# Patient Record
Sex: Female | Born: 1990 | Race: Black or African American | Hispanic: No | Marital: Single | Smoking: Never smoker
Health system: Southern US, Community
[De-identification: ages and names within clinical notes are randomized; demographics above are authoritative.]

## PROBLEM LIST (undated history)

## (undated) DIAGNOSIS — T7840XA Allergy, unspecified, initial encounter: Secondary | ICD-10-CM

## (undated) DIAGNOSIS — K8020 Calculus of gallbladder without cholecystitis without obstruction: Secondary | ICD-10-CM

## (undated) DIAGNOSIS — F32A Depression, unspecified: Secondary | ICD-10-CM

## (undated) DIAGNOSIS — R011 Cardiac murmur, unspecified: Secondary | ICD-10-CM

## (undated) DIAGNOSIS — F329 Major depressive disorder, single episode, unspecified: Secondary | ICD-10-CM

## (undated) HISTORY — DX: Cardiac murmur, unspecified: R01.1

## (undated) HISTORY — DX: Allergy, unspecified, initial encounter: T78.40XA

## (undated) HISTORY — PX: WISDOM TOOTH EXTRACTION: SHX21

## (undated) HISTORY — DX: Major depressive disorder, single episode, unspecified: F32.9

## (undated) HISTORY — DX: Depression, unspecified: F32.A

## (undated) HISTORY — PX: COLONOSCOPY: SHX174

---

## 2012-12-15 ENCOUNTER — Ambulatory Visit (INDEPENDENT_AMBULATORY_CARE_PROVIDER_SITE_OTHER): Payer: Managed Care, Other (non HMO) | Admitting: Obstetrics

## 2012-12-15 ENCOUNTER — Encounter: Payer: Self-pay | Admitting: Obstetrics

## 2012-12-15 VITALS — BP 129/75 | HR 99 | Temp 99.1°F | Ht 65.0 in | Wt 140.0 lb

## 2012-12-15 DIAGNOSIS — Z3009 Encounter for other general counseling and advice on contraception: Secondary | ICD-10-CM

## 2012-12-15 DIAGNOSIS — N946 Dysmenorrhea, unspecified: Secondary | ICD-10-CM

## 2012-12-15 DIAGNOSIS — R109 Unspecified abdominal pain: Secondary | ICD-10-CM

## 2012-12-15 LAB — POCT URINALYSIS DIPSTICK
Spec Grav, UA: 1.02
Urobilinogen, UA: NEGATIVE
pH, UA: 5

## 2012-12-15 MED ORDER — MEDROXYPROGESTERONE ACETATE 150 MG/ML IM SUSP: 150.0000 mg | INTRAMUSCULAR | Status: DC

## 2012-12-15 NOTE — Progress Notes (Signed)
Subjective:    Tamara Gould is a 22 y.o. female new patient who presents for evaluation of menstrual symptoms. Symptoms began 5 months ago. Patient describes symptoms of bloating/fluid retention (mild), labile mood (mild), menorrhagia (mild), menstrual cramping (severe), pelvic pain (severe) and nausea and vomiting. Symptoms occur 3 days prior to onset of menses. Patient denies anxiety, breast tenderness, decreased libido, depression, dyspareunia, insomnia and migraine headaches. Evaluation to date includes: none. Treatment to date includes: OTC NSAIDs (somewhat effective). The patient is sexually active.   Menstrual History: OB History   Grav Para Term Preterm Abortions TAB SAB Ect Mult Living   0 0 0 0 0 0 0 0 0 0       Menarche age: 16  Patient's last menstrual period was 12/14/2012.    The following portions of the patient's history were reviewed and updated as appropriate: allergies, current medications, past family history, past medical history, past social history, past surgical history and problem list.  Review of Systems Pertinent items are noted in HPI.   Objective:    BP 129/75  Pulse 99  Temp(Src) 99.1 F (37.3 C) (Oral)  Ht 5\' 5"  (1.651 m)  Wt 140 lb (63.504 kg)  BMI 23.3 kg/m2  LMP 12/14/2012 General appearance: alert and no distress Abdomen: normal findings: soft, non-tender Pelvic: cervix normal in appearance, external genitalia normal, no adnexal masses or tenderness, no cervical motion tenderness, uterus normal size, shape, and consistency, vagina normal without discharge and menstrual blood present.   Assessment:    Dysmenorrhea: moderate Counseling for contraception.    Plan:    I started Depo per medication orders.

## 2012-12-15 NOTE — Patient Instructions (Signed)
Routine

## 2012-12-18 ENCOUNTER — Ambulatory Visit (INDEPENDENT_AMBULATORY_CARE_PROVIDER_SITE_OTHER): Payer: Managed Care, Other (non HMO) | Admitting: *Deleted

## 2012-12-18 VITALS — BP 112/73 | HR 85 | Wt 135.0 lb

## 2012-12-18 DIAGNOSIS — Z309 Encounter for contraceptive management, unspecified: Secondary | ICD-10-CM

## 2012-12-18 DIAGNOSIS — IMO0001 Reserved for inherently not codable concepts without codable children: Secondary | ICD-10-CM

## 2012-12-18 MED ORDER — MEDROXYPROGESTERONE ACETATE 150 MG/ML IM SUSP
150.0000 mg | INTRAMUSCULAR | Status: DC
  Administered 2012-12-18: 150 mg via INTRAMUSCULAR

## 2012-12-18 NOTE — Progress Notes (Signed)
Pt in office today for a Depo injection.  Medroxyprogesterone 150mg  given IM in Right Deltoid Lot [id number] Exp. 12/2014 Pt to return to office 03-11-13

## 2012-12-28 ENCOUNTER — Ambulatory Visit: Payer: Self-pay | Admitting: Obstetrics & Gynecology

## 2013-03-12 ENCOUNTER — Ambulatory Visit: Payer: Self-pay | Admitting: Obstetrics & Gynecology

## 2013-03-12 ENCOUNTER — Ambulatory Visit: Payer: Managed Care, Other (non HMO)

## 2013-04-02 ENCOUNTER — Ambulatory Visit: Payer: Managed Care, Other (non HMO) | Admitting: Obstetrics & Gynecology

## 2013-04-18 ENCOUNTER — Other Ambulatory Visit: Payer: Self-pay | Admitting: Obstetrics

## 2013-04-19 ENCOUNTER — Ambulatory Visit (INDEPENDENT_AMBULATORY_CARE_PROVIDER_SITE_OTHER): Payer: Managed Care, Other (non HMO) | Admitting: Obstetrics & Gynecology

## 2013-04-19 ENCOUNTER — Other Ambulatory Visit: Payer: Self-pay | Admitting: Obstetrics & Gynecology

## 2013-04-19 ENCOUNTER — Encounter: Payer: Self-pay | Admitting: Obstetrics & Gynecology

## 2013-04-19 VITALS — BP 117/72 | HR 81 | Temp 98.1°F | Ht 65.0 in | Wt 150.0 lb

## 2013-04-19 DIAGNOSIS — Z113 Encounter for screening for infections with a predominantly sexual mode of transmission: Secondary | ICD-10-CM

## 2013-04-19 DIAGNOSIS — Z01419 Encounter for gynecological examination (general) (routine) without abnormal findings: Secondary | ICD-10-CM

## 2013-04-19 DIAGNOSIS — IMO0001 Reserved for inherently not codable concepts without codable children: Secondary | ICD-10-CM

## 2013-04-19 DIAGNOSIS — Z3202 Encounter for pregnancy test, result negative: Secondary | ICD-10-CM

## 2013-04-19 MED ORDER — MEDROXYPROGESTERONE ACETATE 150 MG/ML IM SUSP
150.0000 mg | INTRAMUSCULAR | Status: AC
  Administered 2013-04-27 – 2014-01-08 (×4): 150 mg via INTRAMUSCULAR

## 2013-04-19 NOTE — Patient Instructions (Addendum)
Safe Sex Safe sex is about reducing the risk of giving or getting a sexually transmitted disease (STD). STDs are spread through sexual contact involving the genitals, mouth, or rectum. Some STDS can be cured and others cannot. Safe sex can also prevent unintended pregnancies.  SAFE SEX PRACTICES  Limit your sexual activity to only one partner who is only having sex with you.  Talk to your partner about their past partners, past STDs, and drug use.  Use a condom every time you have sexual intercourse. This includes vaginal, oral, and anal sexual activity. Both females and males should wear condoms during oral sex. Only use latex or polyurethane condoms and water-based lubricants. Petroleum-based lubricants or oils used to lubricate a condom will weaken the condom and increase the chance that it will break. The condom should be in place from the beginning to the end of sexual activity. Wearing a condom reduces, but does not completely eliminate, your risk of getting or giving a STD. STDs can be spread by contact with skin of surrounding areas.  Get vaccinated for hepatitis B and HPV.  Avoid alcohol and recreational drugs which can affect your judgement. You may forget to use a condom or participate in high-risk sex.  For females, avoid douching after sexual intercourse. Douching can spread an infection farther into the reproductive tract.  Check your body for signs of sores, blisters, rashes, or unusual discharge. See your caregiver if you notice any of these signs.  Avoid sexual contact if you have symptoms of an infection or are being treated for an STD. If you or your partner has herpes, avoid sexual contact when blisters are present. Use condoms at all other times.  See your caregiver for regular screenings, examinations, and tests for STDs. Before having sex with a new partner, each of you should be screened for STDs and talk about the results with your partner. BENEFITS OF SAFE SEX   There  is less of a chance of getting or giving an STD.  You can prevent unwanted or unintended pregnancies.  By discussing safer sex concerns with your partner, you may increase feelings of intimacy, comfort, trust, and honesty between the both of you. Document Released: 08/12/2004 Document Revised: 03/29/2012 Document Reviewed: 12/27/2011 [employer] Patient Information 2014 [employer].  

## 2013-04-19 NOTE — Progress Notes (Signed)
Subjective:     Tamara Gould is a 22 y.o. female here for a routine exam.  Current complaints: annual exam. Pt states she had her first Depo in June 2014. Pt states she has been spotting on a daily basis since her injection. Pt was due for a second injection  Personal health questionnaire reviewed: yes.   Gynecologic History No LMP recorded. Patient has had an injection. Contraception: Depo-Provera injections Last Pap: never had a pap smear.  Obstetric History OB History  Gravida Para Term Preterm AB SAB TAB Ectopic Multiple Living  0 0 0 0 0 0 0 0 0 0          The following portions of the patient's history were reviewed and updated as appropriate: allergies, current medications, past family history, past medical history, past social history, past surgical history and problem list.  Review of Systems Pertinent items are noted in HPI.    Objective:      General appearance: alert Breasts: normal appearance Abdomen: soft, non-tender; bowel sounds normal; no masses,  no organomegaly Pelvic: cervix normal in appearance, external genitalia normal, no adnexal masses or tenderness, uterus normal size, shape, and consistency and vagina normal without discharge       Assessment:    Healthy female exam.    Plan:    Counseled re: LARC/safe intercourse/HPV vaccine Resume Depo provera Return prn

## 2013-04-20 LAB — GC/CHLAMYDIA PROBE AMP: GC Probe RNA: NEGATIVE

## 2013-04-27 ENCOUNTER — Ambulatory Visit (INDEPENDENT_AMBULATORY_CARE_PROVIDER_SITE_OTHER): Payer: Managed Care, Other (non HMO) | Admitting: *Deleted

## 2013-04-27 VITALS — BP 120/82 | HR 83 | Temp 97.9°F | Ht 65.0 in | Wt 150.0 lb

## 2013-04-27 DIAGNOSIS — Z3049 Encounter for surveillance of other contraceptives: Secondary | ICD-10-CM

## 2013-04-27 DIAGNOSIS — Z3202 Encounter for pregnancy test, result negative: Secondary | ICD-10-CM

## 2013-04-27 NOTE — Progress Notes (Signed)
Pt in office for a Depo injection.  Pregnancy test in office today is negative. Pt states she last had intercourse 3 weeks ago. Pt to return to office July 19, 2013.

## 2013-07-16 ENCOUNTER — Telehealth: Payer: Self-pay | Admitting: *Deleted

## 2013-07-16 MED ORDER — MEDROXYPROGESTERONE ACETATE 150 MG/ML IM SUSP: 150.0000 mg | Freq: Once | INTRAMUSCULAR | Status: DC

## 2013-07-16 NOTE — Telephone Encounter (Signed)
Patient called and left voice message requesting a refill on Depo. Call was returned to patient she was informed Rx would be sent to pharmacy. She was reminded of her 07/23/2012. Patient has verbalized understanding of appointment. She was reminded to check with pharmacy for future refills on medication.  Rx sent to pharmacy.

## 2013-07-23 ENCOUNTER — Ambulatory Visit (INDEPENDENT_AMBULATORY_CARE_PROVIDER_SITE_OTHER): Payer: Managed Care, Other (non HMO) | Admitting: *Deleted

## 2013-07-23 VITALS — BP 114/78 | HR 78 | Temp 98.1°F | Wt 151.0 lb

## 2013-07-23 DIAGNOSIS — Z309 Encounter for contraceptive management, unspecified: Secondary | ICD-10-CM

## 2013-07-23 DIAGNOSIS — IMO0001 Reserved for inherently not codable concepts without codable children: Secondary | ICD-10-CM

## 2013-07-23 NOTE — Progress Notes (Signed)
Pt is in office today for depo injection.  Injection given in left deltoid. Pt tolerated well. Pt advised RTO 10/14/13.

## 2013-10-15 ENCOUNTER — Ambulatory Visit (INDEPENDENT_AMBULATORY_CARE_PROVIDER_SITE_OTHER): Payer: Managed Care, Other (non HMO) | Admitting: *Deleted

## 2013-10-15 VITALS — BP 104/72 | HR 78 | Temp 97.8°F | Ht 65.0 in | Wt 158.0 lb

## 2013-10-15 DIAGNOSIS — Z309 Encounter for contraceptive management, unspecified: Secondary | ICD-10-CM

## 2013-10-15 DIAGNOSIS — IMO0001 Reserved for inherently not codable concepts without codable children: Secondary | ICD-10-CM

## 2013-10-15 NOTE — Progress Notes (Signed)
Patient is in the office today for her DEPO Injection. Injection given in Right Arm. Patient tolerated well. Patient notified to make an appointment with the front for January 06, 2014 for her next DEPO Injection.

## 2014-01-07 ENCOUNTER — Ambulatory Visit: Payer: Managed Care, Other (non HMO)

## 2014-01-08 ENCOUNTER — Ambulatory Visit (INDEPENDENT_AMBULATORY_CARE_PROVIDER_SITE_OTHER): Payer: Managed Care, Other (non HMO) | Admitting: *Deleted

## 2014-01-08 VITALS — BP 122/78 | HR 76 | Temp 98.4°F | Ht 65.0 in | Wt 161.0 lb

## 2014-01-08 DIAGNOSIS — Z309 Encounter for contraceptive management, unspecified: Secondary | ICD-10-CM

## 2014-01-08 NOTE — Progress Notes (Signed)
Patient is in the office today for her DEPO Injection. Patient is on time for her Injection. Injection given in Left Deltoid. Patient tolerated well. Patient states she is taking a green coffee bean vitamin. Patient denies any concerns at today's visit. Patient advised to make an appointment with the front for April 01, 2014 for next DEPO Injection.   BP 122/78  Pulse 76  Temp(Src) 98.4 F (36.9 C)  Ht 5\' 5"  (1.651 m)  Wt 161 lb (73.029 kg)  BMI 26.79 kg/m2  LMP 01/03/2014  Administrations This Visit   medroxyPROGESTERone (DEPO-PROVERA) injection 150 mg   Administered Action Dose Route Administered By   01/08/2014 Given 150 mg Intramuscular Odessa FlemingKristina M Bohne, LPN

## 2014-04-01 ENCOUNTER — Ambulatory Visit (INDEPENDENT_AMBULATORY_CARE_PROVIDER_SITE_OTHER): Payer: Managed Care, Other (non HMO) | Admitting: *Deleted

## 2014-04-01 VITALS — BP 126/88 | Temp 98.5°F | Ht 65.0 in | Wt 157.0 lb

## 2014-04-01 DIAGNOSIS — Z3049 Encounter for surveillance of other contraceptives: Secondary | ICD-10-CM

## 2014-04-01 DIAGNOSIS — Z3042 Encounter for surveillance of injectable contraceptive: Secondary | ICD-10-CM

## 2014-04-01 MED ORDER — MEDROXYPROGESTERONE ACETATE 150 MG/ML IM SUSP
150.0000 mg | INTRAMUSCULAR | Status: AC
  Administered 2014-04-01 – 2014-12-12 (×4): 150 mg via INTRAMUSCULAR

## 2014-04-01 NOTE — Progress Notes (Signed)
Patient is in the office today for her DEPO Injection. Patient is on time for her Injection. Injection given in Left Deltoid. Patient tolerated well. Patient denies any concerns today. Patient notified to make an appointment with the front for June 23, 2014 for her next DEPO Injection. Patient voiced understanding.   BP 126/88  Temp(Src) 98.5 F (36.9 C)  Ht  (1.651 m)  Wt 157 lb (71.215 kg)  BMI 26.13 kg/m2  Administrations This Visit   medroxyPROGESTERone (DEPO-PROVERA) injection 150 mg   Administered Action Dose Route Administered By   04/01/2014 Given 150 mg Intramuscular Odessa Fleming, LPN

## 2014-06-24 ENCOUNTER — Ambulatory Visit: Payer: Managed Care, Other (non HMO)

## 2014-06-26 ENCOUNTER — Ambulatory Visit (INDEPENDENT_AMBULATORY_CARE_PROVIDER_SITE_OTHER): Payer: Managed Care, Other (non HMO) | Admitting: *Deleted

## 2014-06-26 VITALS — BP 118/77 | HR 88 | Temp 98.2°F | Wt 157.0 lb

## 2014-06-26 DIAGNOSIS — Z3042 Encounter for surveillance of injectable contraceptive: Secondary | ICD-10-CM

## 2014-06-26 NOTE — Progress Notes (Signed)
Pt is in office today for depo injection.  Pt is on time for injection.  Pt tolerated injection well. Pt advised to RTO on 09-18-14 for next injection.  Pt made aware that she is also due for annual exam and can schedule that at time of next depo.   Pt state that it has been quite some time since she has been sexually active.  Pt states that she is now sexually active again.  Pt states that she has had some bleeding after intercourse.  Pt state that the first time was a bit heavy but was gone the next day.  Pt states that she will have some light spotting after each intercourse.  Pt made aware that she may continue to watch her spotting since just recently sexually active.  Pt made aware if bleeding continues that she may want to schedule an appt for follow up.   Pt states understanding.    BP 118/77 mmHg  Pulse 88  Temp(Src) 98.2 F (36.8 C)  Wt 157 lb (71.215 kg)   Administrations This Visit    medroxyPROGESTERone (DEPO-PROVERA) injection 150 mg    Administered Action Dose Route Administered By         06/26/2014 Given 150 mg Intramuscular Lanney GinsSuzanne D Lavaughn Haberle, CMA

## 2014-09-16 ENCOUNTER — Other Ambulatory Visit: Payer: Self-pay | Admitting: *Deleted

## 2014-09-16 DIAGNOSIS — Z3042 Encounter for surveillance of injectable contraceptive: Secondary | ICD-10-CM

## 2014-09-16 MED ORDER — MEDROXYPROGESTERONE ACETATE 150 MG/ML IM SUSP: 150.0000 mg | Freq: Once | INTRAMUSCULAR | Status: DC

## 2014-09-18 ENCOUNTER — Ambulatory Visit: Payer: Managed Care, Other (non HMO)

## 2014-09-18 ENCOUNTER — Telehealth: Payer: Self-pay | Admitting: Obstetrics

## 2014-09-20 ENCOUNTER — Ambulatory Visit (INDEPENDENT_AMBULATORY_CARE_PROVIDER_SITE_OTHER): Payer: Managed Care, Other (non HMO) | Admitting: *Deleted

## 2014-09-20 VITALS — BP 120/80 | HR 89 | Temp 98.0°F | Wt 159.0 lb

## 2014-09-20 DIAGNOSIS — Z3042 Encounter for surveillance of injectable contraceptive: Secondary | ICD-10-CM

## 2014-09-20 NOTE — Progress Notes (Signed)
Pt is in office today for depo injection.  Pt is on time for injection, pt tolerated well.  Pt advised to RTO on May 26,2016 for next injection.  Pt was made aware that she is due for AEX in order to continue with medication.  Pt was offered to be seen today for annual, pt request to schedule at another time.  Pt was made aware that she would not be able to have Rx refill until AEX is completed. Pt states her understanding.  Pt has no other concerns today.   BP 120/80 mmHg  Pulse 89  Temp(Src) 98 F (36.7 C)  Wt 159 lb (72.122 kg)  Administrations This Visit    medroxyPROGESTERone (DEPO-PROVERA) injection 150 mg    Admin Date Action Dose Route Administered By         09/20/2014 Given 150 mg Intramuscular Lanney GinsSuzanne D Lenox Ladouceur, CMA

## 2014-09-20 NOTE — Telephone Encounter (Signed)
[id number] - Patient has scheduled all appts. brm

## 2014-11-19 ENCOUNTER — Encounter: Payer: Self-pay | Admitting: Certified Nurse Midwife

## 2014-11-19 ENCOUNTER — Ambulatory Visit (INDEPENDENT_AMBULATORY_CARE_PROVIDER_SITE_OTHER): Payer: Managed Care, Other (non HMO) | Admitting: Certified Nurse Midwife

## 2014-11-19 VITALS — BP 107/76 | HR 84 | Temp 98.4°F | Ht 64.0 in | Wt 162.0 lb

## 2014-11-19 DIAGNOSIS — Z3042 Encounter for surveillance of injectable contraceptive: Secondary | ICD-10-CM | POA: Diagnosis not present

## 2014-11-19 DIAGNOSIS — Z01419 Encounter for gynecological examination (general) (routine) without abnormal findings: Secondary | ICD-10-CM | POA: Diagnosis not present

## 2014-11-19 LAB — CBC
HCT: 45.4 % (ref 36.0–46.0)
HEMOGLOBIN: 15.4 g/dL — AB (ref 12.0–15.0)
MCH: 28.2 pg (ref 26.0–34.0)
MCHC: 33.9 g/dL (ref 30.0–36.0)
MCV: 83 fL (ref 78.0–100.0)
MPV: 9.9 fL (ref 8.6–12.4)
Platelets: 200 10*3/uL (ref 150–400)
RBC: 5.47 MIL/uL — AB (ref 3.87–5.11)
RDW: 13.5 % (ref 11.5–15.5)
WBC: 3.8 10*3/uL — AB (ref 4.0–10.5)

## 2014-11-19 LAB — COMPREHENSIVE METABOLIC PANEL
ALT: 10 U/L (ref 0–35)
AST: 12 U/L (ref 0–37)
Albumin: 4.7 g/dL (ref 3.5–5.2)
Alkaline Phosphatase: 46 U/L (ref 39–117)
BILIRUBIN TOTAL: 0.5 mg/dL (ref 0.2–1.2)
BUN: 6 mg/dL (ref 6–23)
CALCIUM: 9.5 mg/dL (ref 8.4–10.5)
CO2: 26 mEq/L (ref 19–32)
Chloride: 102 mEq/L (ref 96–112)
Creat: 0.74 mg/dL (ref 0.50–1.10)
Glucose, Bld: 86 mg/dL (ref 70–99)
Potassium: 4.2 mEq/L (ref 3.5–5.3)
Sodium: 138 mEq/L (ref 135–145)
Total Protein: 7.8 g/dL (ref 6.0–8.3)

## 2014-11-19 MED ORDER — MEDROXYPROGESTERONE ACETATE 150 MG/ML IM SUSP: 150.0000 mg | Freq: Once | INTRAMUSCULAR | Status: DC

## 2014-11-20 LAB — HIV ANTIBODY (ROUTINE TESTING W REFLEX): HIV 1&2 Ab, 4th Generation: NONREACTIVE

## 2014-11-20 LAB — PAP IG W/ RFLX HPV ASCU

## 2014-11-20 LAB — HEPATITIS B SURFACE ANTIGEN: HEP B S AG: NEGATIVE

## 2014-11-20 LAB — HEPATITIS C ANTIBODY: HCV Ab: NEGATIVE

## 2014-11-20 LAB — RPR

## 2014-11-20 LAB — TSH: TSH: 1.704 u[IU]/mL (ref 0.350–4.500)

## 2014-11-20 NOTE — Progress Notes (Signed)
Patient ID: Tamara Gould, female   DOB: 1991/01/15, 24 y.o.   MRN: [id number]    Subjective:      Tamara Gould is a 24 y.o. female here for a routine exam.  Current complaints: none, desires to continue on Depo injections for birth control, other options discussed.  Denies any problems is currently employed.    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    Personal health questionnaire:  Is patient [name], have a family history of breast and/or ovarian cancer: no Is there a family history of uterine cancer diagnosed at age < 3050, gastrointestinal cancer, urinary tract cancer, family member who is a Personnel officerLynch syndrome-associated carrier: no Is the patient overweight and hypertensive, family history of diabetes, personal history of gestational diabetes, preeclampsia or PCOS: no Is patient over 3655, have PCOS,  family history of premature CHD under age 22, diabetes, smoke, have hypertension or peripheral artery disease:  no At any time, has a partner hit, kicked or otherwise hurt or frightened you?: no Over the past 2 weeks, have you felt down, depressed or hopeless?: no Over the past 2 weeks, have you felt little interest or pleasure in doing things?:no   Gynecologic History No LMP recorded. Patient has had an injection. Contraception: Depo-Provera injections Last Pap: unknown. Results were: patient states she has had pap in the past, educated that normally not started until after 21 yrs of age, according to patient past pap smear was normal Last mammogram: N/A.   Obstetric History OB History  Gravida Para Term Preterm AB SAB TAB Ectopic Multiple Living  0 0 0 0 0 0 0 0 0 0         History reviewed. No pertinent past medical history.  Past Surgical History  Procedure Laterality Date  . Wisdom tooth extraction       Current outpatient prescriptions:  .  medroxyPROGESTERone (DEPO-PROVERA) 150 MG/ML injection, Inject 1 mL (150 mg  total) into the muscle once., Disp: 1 mL, Rfl: 0  Current facility-administered medications:  .  medroxyPROGESTERone (DEPO-PROVERA) injection 150 mg, 150 mg, Intramuscular, Q90 days, Antionette CharLisa Jackson-Moore, MD, 150 mg at 09/20/14 1026 No Known Allergies  History  Substance Use Topics  . Smoking status: Never Smoker   . Smokeless tobacco: Never Used  . Alcohol Use: No    Family History  Problem Relation Age of Onset  . Hypertension Father       Review of Systems  Constitutional: negative for fatigue and weight loss Respiratory: negative for cough and wheezing Cardiovascular: negative for chest pain, fatigue and palpitations Gastrointestinal: negative for abdominal pain and change in bowel habits Musculoskeletal:negative for myalgias Neurological: negative for gait problems and tremors Behavioral/Psych: negative for abusive relationship, depression Endocrine: negative for temperature intolerance   Genitourinary:negative for abnormal menstrual periods, genital lesions, hot flashes, sexual problems and vaginal discharge Integument/breast: negative for breast lump, breast tenderness, nipple discharge and skin lesion(s)    Objective:       BP 107/76 mmHg  Pulse 84  Temp(Src) 98.4 F (36.9 C)  Ht 5\' 4"  (1.626 m)  Wt 73.483 kg (162 lb)  BMI 27.79 kg/m2 General:   alert  Skin:   no rash or abnormalities  Lungs:   clear to auscultation bilaterally  Heart:   regular rate and rhythm, S1, S2 normal, no murmur, click, rub or gallop  Breasts:   normal without suspicious masses, skin or nipple changes  or axillary nodes  Abdomen:  normal findings: no organomegaly, soft, non-tender and no hernia  Pelvis:  External genitalia: normal general appearance Urinary system: urethral meatus normal and bladder without fullness, nontender Vaginal: normal without tenderness, induration or masses Cervix: normal appearance Adnexa: normal bimanual exam Uterus: anteverted and non-tender, normal size    Lab Review Urine pregnancy test Labs reviewed yes Radiologic studies reviewed no  50% of 30 min visit spent on counseling and coordination of care.   Assessment:    Healthy female exam.   Contraception/safe sex counseling   Plan:    Education reviewed: depression evaluation, low fat, low cholesterol diet, safe sex/STD prevention, self breast exams, skin cancer screening and weight bearing exercise. Contraception: Depo-Provera injections. Follow up in: 1 year.   Meds ordered this encounter  Medications  . medroxyPROGESTERone (DEPO-PROVERA) 150 MG/ML injection    Sig: Inject 1 mL (150 mg total) into the muscle once.    Dispense:  1 mL    Refill:  0   Orders Placed This Encounter  Procedures  . SureSwab, Vaginosis/Vaginitis Plus  . HIV antibody (with reflex)  . Hepatitis B surface antigen  . RPR  . Hepatitis C antibody  . TSH  . CBC  . Comprehensive metabolic panel

## 2014-11-22 LAB — SURESWAB, VAGINOSIS/VAGINITIS PLUS
Atopobium vaginae: NOT DETECTED Log (cells/mL)
C. ALBICANS, DNA: NOT DETECTED
C. GLABRATA, DNA: NOT DETECTED
C. PARAPSILOSIS, DNA: NOT DETECTED
C. TRACHOMATIS RNA, TMA: NOT DETECTED
C. tropicalis, DNA: NOT DETECTED
Gardnerella vaginalis: 5.2 Log (cells/mL)
LACTOBACILLUS SPECIES: >8 Log (cells/mL)
MEGASPHAERA SPECIES: NOT DETECTED Log (cells/mL)
N. gonorrhoeae RNA, TMA: NOT DETECTED
T. VAGINALIS RNA, QL TMA: NOT DETECTED

## 2014-12-12 ENCOUNTER — Ambulatory Visit (INDEPENDENT_AMBULATORY_CARE_PROVIDER_SITE_OTHER): Payer: Managed Care, Other (non HMO) | Admitting: *Deleted

## 2014-12-12 VITALS — BP 118/80 | HR 84 | Wt 161.0 lb

## 2014-12-12 DIAGNOSIS — Z3042 Encounter for surveillance of injectable contraceptive: Secondary | ICD-10-CM

## 2014-12-12 DIAGNOSIS — O2692 Pregnancy related conditions, unspecified, second trimester: Secondary | ICD-10-CM | POA: Diagnosis not present

## 2014-12-12 NOTE — Progress Notes (Signed)
Pt is in office today for depo injection.  Pt is on time for injection.  Pt tolerated injection well.  Pt has no other concerns today.  Pt advised to RTO on August 17,2016 for next injection.    BP 118/80 mmHg  Pulse 84  Wt 161 lb (73.029 kg)  Administrations This Visit    medroxyPROGESTERone (DEPO-PROVERA) injection 150 mg    Admin Date Action Dose Route Administered By         12/12/2014 Given 150 mg Intramuscular Lanney GinsSuzanne D Jossue Rubenstein, CMA

## 2015-03-05 ENCOUNTER — Ambulatory Visit: Payer: Managed Care, Other (non HMO)

## 2015-03-07 ENCOUNTER — Other Ambulatory Visit: Payer: Self-pay | Admitting: Certified Nurse Midwife

## 2015-03-10 ENCOUNTER — Ambulatory Visit (INDEPENDENT_AMBULATORY_CARE_PROVIDER_SITE_OTHER): Payer: Managed Care, Other (non HMO) | Admitting: *Deleted

## 2015-03-10 VITALS — BP 116/75 | HR 81 | Temp 98.2°F | Ht 65.0 in | Wt 160.0 lb

## 2015-03-10 DIAGNOSIS — Z3040 Encounter for surveillance of contraceptives, unspecified: Secondary | ICD-10-CM

## 2015-03-10 MED ORDER — MEDROXYPROGESTERONE ACETATE 150 MG/ML IM SUSP
150.0000 mg | Freq: Once | INTRAMUSCULAR | Status: AC
  Administered 2015-03-10: 150 mg via INTRAMUSCULAR

## 2015-03-10 NOTE — Progress Notes (Signed)
Patient in office today for a Depo injection. Patient is on time for her injection. Patient tolerated injection well. Patient due for injection on June 01, 2015.  BP 116/75 mmHg  Pulse 81  Temp(Src) 98.2 F (36.8 C)  Ht  (1.651 m)  Wt 160 lb (72.576 kg)  BMI 26.63 kg/m2  Administrations This Visit    medroxyPROGESTERone (DEPO-PROVERA) injection 150 mg    Admin Date Action Dose Route Administered By         03/10/2015 Given 150 mg Intramuscular Henriette Combs, LPN

## 2015-06-02 ENCOUNTER — Ambulatory Visit: Payer: Managed Care, Other (non HMO)

## 2015-06-02 ENCOUNTER — Other Ambulatory Visit: Payer: Self-pay | Admitting: Obstetrics

## 2015-06-04 ENCOUNTER — Ambulatory Visit (INDEPENDENT_AMBULATORY_CARE_PROVIDER_SITE_OTHER): Payer: Managed Care, Other (non HMO) | Admitting: *Deleted

## 2015-06-04 VITALS — BP 109/75 | HR 80 | Temp 98.6°F | Ht 65.0 in | Wt 158.0 lb

## 2015-06-04 DIAGNOSIS — Z3042 Encounter for surveillance of injectable contraceptive: Secondary | ICD-10-CM

## 2015-06-04 MED ORDER — MEDROXYPROGESTERONE ACETATE 150 MG/ML IM SUSP
150.0000 mg | INTRAMUSCULAR | Status: AC
  Administered 2015-06-04 – 2015-08-26 (×2): 150 mg via INTRAMUSCULAR

## 2015-06-04 NOTE — Progress Notes (Signed)
Patient in office for a Depo injection. Patient is on time for her injection. Patient tolerated injection well.  Patient due for next injection on 08-26-15.  BP 109/75 mmHg  Pulse 80  Temp(Src) 98.6 F (37 C)  Ht 5\' 5"  (1.651 m)  Wt 158 lb (71.668 kg)  BMI 26.29 kg/m2   Administrations This Visit    medroxyPROGESTERone (DEPO-PROVERA) injection 150 mg    Admin Date Action Dose Route Administered By         06/04/2015 Given 150 mg Intramuscular Henriette CombsAndrea L Quaid Yeakle, LPN

## 2015-07-04 ENCOUNTER — Encounter: Payer: Self-pay | Admitting: Certified Nurse Midwife

## 2015-07-04 ENCOUNTER — Ambulatory Visit (INDEPENDENT_AMBULATORY_CARE_PROVIDER_SITE_OTHER): Payer: Managed Care, Other (non HMO) | Admitting: Certified Nurse Midwife

## 2015-07-04 VITALS — BP 123/80 | HR 88 | Temp 99.0°F | Wt 156.0 lb

## 2015-07-04 DIAGNOSIS — N760 Acute vaginitis: Secondary | ICD-10-CM

## 2015-07-04 DIAGNOSIS — N898 Other specified noninflammatory disorders of vagina: Secondary | ICD-10-CM

## 2015-07-04 DIAGNOSIS — A499 Bacterial infection, unspecified: Secondary | ICD-10-CM | POA: Diagnosis not present

## 2015-07-04 DIAGNOSIS — B3731 Acute candidiasis of vulva and vagina: Secondary | ICD-10-CM

## 2015-07-04 DIAGNOSIS — B373 Candidiasis of vulva and vagina: Secondary | ICD-10-CM | POA: Diagnosis not present

## 2015-07-04 DIAGNOSIS — B9689 Other specified bacterial agents as the cause of diseases classified elsewhere: Secondary | ICD-10-CM

## 2015-07-04 MED ORDER — FLUCONAZOLE 100 MG PO TABS: 100.0000 mg | ORAL_TABLET | Freq: Once | ORAL | Status: DC

## 2015-07-04 MED ORDER — METRONIDAZOLE 0.75 % VA GEL: 1.0000 | VAGINAL | Status: DC

## 2015-07-04 MED ORDER — TERCONAZOLE 0.4 % VA CREA: 1.0000 | TOPICAL_CREAM | Freq: Every day | VAGINAL | Status: DC

## 2015-07-04 MED ORDER — TINIDAZOLE 500 MG PO TABS: 2.0000 g | ORAL_TABLET | Freq: Every day | ORAL | Status: AC

## 2015-07-04 NOTE — Progress Notes (Signed)
Patient ID: Tamara Gould, female   DOB: Jan 04, 1991, 24 y.o.   MRN: [id number]  Chief Complaint  Patient presents with  . Vaginal Discharge    Vaginal discharge with odor.     HPI Tamara Gould is a 24 y.o. female.  Has had vaginal discharge with odor for about 2 years.  Has not tried anything for it in the past.  Denies any vaginal douching.   Is currently sexually active.  States that she uses dial soap to wash with.   HPI  History reviewed. No pertinent past medical history.  Past Surgical History  Procedure Laterality Date  . Wisdom tooth extraction      Family History  Problem Relation Age of Onset  . Hypertension Father     Social History Social History  Substance Use Topics  . Smoking status: Never Smoker   . Smokeless tobacco: Never Used  . Alcohol Use: No    No Known Allergies  Current Outpatient Prescriptions  Medication Sig Dispense Refill  . MedroxyPROGESTERone Acetate 150 MG/ML SUSY INJECT 1 ML INTO THE MUSCLE ONCE. 1 Syringe 0  . fluconazole (DIFLUCAN) 100 MG tablet Take 1 tablet (100 mg total) by mouth once. Repeat dose in 48-72 hour. 3 tablet 0  . metroNIDAZOLE (METROGEL VAGINAL) 0.75 % vaginal gel Place 1 Applicatorful vaginally 2 (two) times a week. For 4-6 months. 70 g 4  . terconazole (TERAZOL 7) 0.4 % vaginal cream Place 1 applicator vaginally at bedtime. 45 g 0  . tinidazole (TINDAMAX) 500 MG tablet Take 4 tablets (2,000 mg total) by mouth daily with breakfast. 12 tablet 0   Current Facility-Administered Medications  Medication Dose Route Frequency Provider Last Rate Last Dose  . medroxyPROGESTERone (DEPO-PROVERA) injection 150 mg  150 mg Intramuscular Q90 days Brock Bad, MD   150 mg at 06/04/15 1018    Review of Systems Review of Systems Constitutional: negative for fatigue and weight loss Respiratory: negative for cough and wheezing Cardiovascular: negative for chest pain, fatigue and palpitations Gastrointestinal: negative for  abdominal pain and change in bowel habits Genitourinary: + vaginal discharge Integument/breast: negative for nipple discharge Musculoskeletal:negative for myalgias Neurological: negative for gait problems and tremors Behavioral/Psych: negative for abusive relationship, depression Endocrine: negative for temperature intolerance     Blood pressure 123/80, pulse 88, temperature 99 F (37.2 C), weight 156 lb (70.761 kg).  Physical Exam Physical Exam General:   alert  Skin:   no rash or abnormalities  Lungs:   clear to auscultation bilaterally  Heart:   regular rate and rhythm, S1, S2 normal, no murmur, click, rub or gallop  Breasts:   deferred  Abdomen:  normal findings: no organomegaly, soft, non-tender and no hernia  Pelvis:  External genitalia: normal general appearance Urinary system: urethral meatus normal and bladder without fullness, nontender Vaginal: normal without tenderness, induration or masses Cervix: deferred Adnexa: deferred Uterus: deferred    50% of 15 min visit spent on counseling and coordination of care.   Data Reviewed Previous medical hx, labs, meds  Assessment     BV     Plan    Orders Placed This Encounter  Procedures  . SureSwab, Vaginosis/Vaginitis Plus   Meds ordered this encounter  Medications  . tinidazole (TINDAMAX) 500 MG tablet    Sig: Take 4 tablets (2,000 mg total) by mouth daily with breakfast.    Dispense:  12 tablet    Refill:  0  . metroNIDAZOLE (METROGEL VAGINAL) 0.75 % vaginal gel  Sig: Place 1 Applicatorful vaginally 2 (two) times a week. For 4-6 months.    Dispense:  70 g    Refill:  4  . fluconazole (DIFLUCAN) 100 MG tablet    Sig: Take 1 tablet (100 mg total) by mouth once. Repeat dose in 48-72 hour.    Dispense:  3 tablet    Refill:  0  . terconazole (TERAZOL 7) 0.4 % vaginal cream    Sig: Place 1 applicator vaginally at bedtime.    Dispense:  45 g    Refill:  0    Possible management options include: chronic BV  treatment Follow up as needed.

## 2015-07-09 LAB — SURESWAB, VAGINOSIS/VAGINITIS PLUS
ATOPOBIUM VAGINAE: NOT DETECTED Log (cells/mL)
C. ALBICANS, DNA: NOT DETECTED
C. GLABRATA, DNA: NOT DETECTED
C. TRACHOMATIS RNA, TMA: NOT DETECTED
C. parapsilosis, DNA: NOT DETECTED
C. tropicalis, DNA: NOT DETECTED
GARDNERELLA VAGINALIS: NOT DETECTED Log (cells/mL)
LACTOBACILLUS SPECIES: 7.6 Log (cells/mL)
MEGASPHAERA SPECIES: NOT DETECTED Log (cells/mL)
N. GONORRHOEAE RNA, TMA: NOT DETECTED
T. VAGINALIS RNA, QL TMA: NOT DETECTED

## 2015-08-25 ENCOUNTER — Other Ambulatory Visit: Payer: Self-pay | Admitting: Obstetrics

## 2015-08-26 ENCOUNTER — Ambulatory Visit (INDEPENDENT_AMBULATORY_CARE_PROVIDER_SITE_OTHER): Payer: Managed Care, Other (non HMO) | Admitting: *Deleted

## 2015-08-26 VITALS — BP 117/83 | HR 90 | Temp 98.6°F | Wt 161.0 lb

## 2015-08-26 DIAGNOSIS — Z3042 Encounter for surveillance of injectable contraceptive: Secondary | ICD-10-CM

## 2015-08-26 NOTE — Progress Notes (Signed)
Patient in office for a Depo injection. Patient is on time for injection. Patient tolerated injection well. Patient due for next injection Nov 17, 2015.  BP 117/83 mmHg  Pulse 90  Temp(Src) 98.6 F (37 C)  Wt 161 lb (73.029 kg)   Administrations This Visit    medroxyPROGESTERone (DEPO-PROVERA) injection 150 mg    Admin Date Action Dose Route Administered By         08/26/2015 Given 150 mg Intramuscular Henriette Combs, LPN

## 2015-11-20 ENCOUNTER — Ambulatory Visit: Payer: Managed Care, Other (non HMO) | Admitting: Certified Nurse Midwife

## 2015-11-20 ENCOUNTER — Other Ambulatory Visit: Payer: Self-pay | Admitting: Obstetrics

## 2015-11-21 ENCOUNTER — Ambulatory Visit (INDEPENDENT_AMBULATORY_CARE_PROVIDER_SITE_OTHER): Payer: Managed Care, Other (non HMO) | Admitting: Certified Nurse Midwife

## 2015-11-21 ENCOUNTER — Encounter: Payer: Self-pay | Admitting: Certified Nurse Midwife

## 2015-11-21 VITALS — BP 120/74 | HR 86 | Wt 158.0 lb

## 2015-11-21 DIAGNOSIS — Z3042 Encounter for surveillance of injectable contraceptive: Secondary | ICD-10-CM

## 2015-11-21 DIAGNOSIS — B3731 Acute candidiasis of vulva and vagina: Secondary | ICD-10-CM

## 2015-11-21 DIAGNOSIS — B373 Candidiasis of vulva and vagina: Secondary | ICD-10-CM

## 2015-11-21 DIAGNOSIS — N760 Acute vaginitis: Secondary | ICD-10-CM

## 2015-11-21 DIAGNOSIS — Z01419 Encounter for gynecological examination (general) (routine) without abnormal findings: Secondary | ICD-10-CM | POA: Diagnosis not present

## 2015-11-21 DIAGNOSIS — Z3049 Encounter for surveillance of other contraceptives: Secondary | ICD-10-CM

## 2015-11-21 DIAGNOSIS — B9689 Other specified bacterial agents as the cause of diseases classified elsewhere: Secondary | ICD-10-CM | POA: Insufficient documentation

## 2015-11-21 MED ORDER — MEDROXYPROGESTERONE ACETATE 150 MG/ML IM SUSP
150.0000 mg | Freq: Once | INTRAMUSCULAR | Status: AC
  Administered 2015-11-21: 150 mg via INTRAMUSCULAR

## 2015-11-21 MED ORDER — FLUCONAZOLE 100 MG PO TABS: 100.0000 mg | ORAL_TABLET | Freq: Once | ORAL | Status: DC

## 2015-11-21 MED ORDER — TERCONAZOLE 0.4 % VA CREA: 1.0000 | TOPICAL_CREAM | Freq: Every day | VAGINAL | Status: DC

## 2015-11-21 NOTE — Addendum Note (Signed)
Addended by: Marya LandryFOSTER, Jolonda Gomm D on: 11/21/2015 11:03 AM   Modules accepted: Orders

## 2015-11-21 NOTE — Progress Notes (Signed)
Patient ID: Tamara SquibbMonique Gould, female   DOB: [birth year], 25 y.o.   MRN: [id number]    Subjective:      Tamara Gould is a 25 y.o. female here for a routine exam.  Current complaints: has vaginal odor, is on metrogel long term for chronic BV.  Depo injection today.  Amenorrhea with Depo injections.  Not currently sexually active.  Declines blood testing for STDs.        Personal health questionnaire:  Is patient [name], have a family history of breast and/or ovarian cancer: yes, Maternal Aunt Is there a family history of uterine cancer diagnosed at age < 6850, gastrointestinal cancer, urinary tract cancer, family member who is a Personnel officerLynch syndrome-associated carrier: no Is the patient overweight and hypertensive, family history of diabetes, personal history of gestational diabetes, preeclampsia or PCOS: no Is patient over 2055, have PCOS,  family history of premature CHD under age 23, diabetes, smoke, have hypertension or peripheral artery disease:  yes At any time, has a partner hit, kicked or otherwise hurt or frightened you?: no Over the past 2 weeks, have you felt down, depressed or hopeless?: no Over the past 2 weeks, have you felt little interest or pleasure in doing things?:no   Gynecologic History No LMP recorded. Patient has had an injection. Contraception: Depo-Provera injections Last Pap: 11/19/14. Results were: normal Last mammogram: N/A.   Obstetric History OB History  Gravida Para Term Preterm AB SAB TAB Ectopic Multiple Living  0 0 0 0 0 0 0 0 0 0         No past medical history on file.  Past Surgical History  Procedure Laterality Date  . Wisdom tooth extraction       Current outpatient prescriptions:  Marland Kitchen.  MedroxyPROGESTERone Acetate 150 MG/ML SUSY, INJECT 1 ML INTO THE MUSCLE ONCE., Disp: 1 Syringe, Rfl: 0 .  metroNIDAZOLE (METROGEL VAGINAL) 0.75 % vaginal gel, Place 1 Applicatorful vaginally 2 (two) times a week. For 4-6 months., Disp: 70 g, Rfl: 4  Current  facility-administered medications:  .  medroxyPROGESTERone (DEPO-PROVERA) injection 150 mg, 150 mg, Intramuscular, Q90 days, Brock Badharles A Harper, MD, 150 mg at 08/26/15 1048 No Known Allergies  Social History  Substance Use Topics  . Smoking status: Never Smoker   . Smokeless tobacco: Never Used  . Alcohol Use: No    Family History  Problem Relation Age of Onset  . Hypertension Father       Review of Systems  Constitutional: negative for fatigue and weight loss Respiratory: negative for cough and wheezing Cardiovascular: negative for chest pain, fatigue and palpitations Gastrointestinal: negative for abdominal pain and change in bowel habits Musculoskeletal:negative for myalgias Neurological: negative for gait problems and tremors Behavioral/Psych: negative for abusive relationship, depression Endocrine: negative for temperature intolerance   Genitourinary:negative for abnormal menstrual periods, genital lesions, hot flashes, sexual problems and vaginal discharge Integument/breast: negative for breast lump, breast tenderness, nipple discharge and skin lesion(s)    Objective:       BP 120/74 mmHg  Pulse 86  Wt 158 lb (71.668 kg) General:   alert  Skin:   no rash or abnormalities  Lungs:   clear to auscultation bilaterally  Heart:   regular rate and rhythm, S1, S2 normal, no murmur, click, rub or gallop  Breasts:   normal without suspicious masses, skin or nipple changes or axillary nodes  Abdomen:  normal findings: no organomegaly, soft, non-tender and no hernia  Pelvis:  External genitalia: normal general appearance Urinary system:  urethral meatus normal and bladder without fullness, nontender Vaginal: normal without tenderness, induration or masses, +  White chunky discharge Cervix: normal appearance Adnexa: normal bimanual exam Uterus: anteverted and non-tender, normal size   Lab Review Urine pregnancy test Labs reviewed yes Radiologic studies reviewed yes  50% of  30 min visit spent on counseling and coordination of care.   Assessment:    Healthy female exam.   Vulvovaginal candidiasis  Plan:    Education reviewed: calcium supplements, depression evaluation, low fat, low cholesterol diet, safe sex/STD prevention, self breast exams, skin cancer screening and weight bearing exercise. Contraception: Depo-Provera injections. Follow up in: 1 year.   Meds ordered this encounter  Medications  . medroxyPROGESTERone (DEPO-PROVERA) injection 150 mg    Sig:    No orders of the defined types were placed in this encounter.    Possible management options include: boric acid Follow up as needed/ 7yr annual exam

## 2015-11-21 NOTE — Progress Notes (Signed)
Patient has tolerated Depo injection well in L-arm. RTO 02-12-16

## 2015-11-24 LAB — NUSWAB VAGINITIS (VG)
CANDIDA GLABRATA, NAA: NEGATIVE
Candida albicans, NAA: NEGATIVE
Trich vag by NAA: NEGATIVE

## 2015-11-26 LAB — PAP IG W/ RFLX HPV ASCU: PAP Smear Comment: 0

## 2016-02-25 ENCOUNTER — Other Ambulatory Visit: Payer: Self-pay | Admitting: Certified Nurse Midwife

## 2016-02-27 ENCOUNTER — Ambulatory Visit: Payer: Managed Care, Other (non HMO)

## 2016-07-06 ENCOUNTER — Other Ambulatory Visit: Payer: Self-pay

## 2016-11-23 ENCOUNTER — Ambulatory Visit: Payer: Self-pay | Admitting: Certified Nurse Midwife

## 2016-12-07 ENCOUNTER — Ambulatory Visit (INDEPENDENT_AMBULATORY_CARE_PROVIDER_SITE_OTHER): Payer: BLUE CROSS/BLUE SHIELD | Admitting: Certified Nurse Midwife

## 2016-12-07 ENCOUNTER — Encounter: Payer: Self-pay | Admitting: Certified Nurse Midwife

## 2016-12-07 VITALS — BP 119/82 | HR 80 | Ht 65.0 in | Wt 141.0 lb

## 2016-12-07 DIAGNOSIS — Z113 Encounter for screening for infections with a predominantly sexual mode of transmission: Secondary | ICD-10-CM | POA: Diagnosis not present

## 2016-12-07 DIAGNOSIS — Z01419 Encounter for gynecological examination (general) (routine) without abnormal findings: Secondary | ICD-10-CM

## 2016-12-07 NOTE — Progress Notes (Signed)
Subjective:        Tamara Gould is a 26 y.o. female here for a routine exam.  Current complaints: none, same sexual partner for 3 years. Is not using condoms, is using natural family planning methods, Declines birth control.  Does not desire pregnancy, discussed calendar method, verbalized understanding. Stopped Depo injections in August.   Desires STD screening.   Personal health questionnaire:  Is patient [name], have a family history of breast and/or ovarian cancer: yes Is there a family history of uterine cancer diagnosed at age < 9, gastrointestinal cancer, urinary tract cancer, family member who is a Personnel officer syndrome-associated carrier: no Is the patient overweight and hypertensive, family history of diabetes, personal history of gestational diabetes, preeclampsia or PCOS: no Is patient over 23, have PCOS,  family history of premature CHD under age 32, diabetes, smoke, have hypertension or peripheral artery disease:  no At any time, has a partner hit, kicked or otherwise hurt or frightened you?: no Over the past 2 weeks, have you felt down, depressed or hopeless?: no Over the past 2 weeks, have you felt little interest or pleasure in doing things?:no   Gynecologic History Patient's last menstrual period was 11/16/2016. Contraception: rhythm method Last Pap: 11/21/2015. Results were: normal Last mammogram: n/a <40  Obstetric History OB History  Gravida Para Term Preterm AB Living  0 0 0 0 0 0  SAB TAB Ectopic Multiple Live Births  0 0 0 0          History reviewed. No pertinent past medical history.  Past Surgical History:  Procedure Laterality Date  . WISDOM TOOTH EXTRACTION      No current outpatient prescriptions on file. No Known Allergies  Social History  Substance Use Topics  . Smoking status: Never Smoker  . Smokeless tobacco: Never Used  . Alcohol use No    Family History  Problem Relation Age of Onset  . Hypertension Father       Review  of Systems  Constitutional: negative for fatigue and weight loss Respiratory: negative for cough and wheezing Cardiovascular: negative for chest pain, fatigue and palpitations Gastrointestinal: negative for abdominal pain and change in bowel habits Musculoskeletal:negative for myalgias Neurological: negative for gait problems and tremors Behavioral/Psych: negative for abusive relationship, depression Endocrine: negative for temperature intolerance    Genitourinary:negative for abnormal menstrual periods, genital lesions, hot flashes, sexual problems and vaginal discharge Integument/breast: negative for breast lump, breast tenderness, nipple discharge and skin lesion(s)    Objective:       BP 119/82   Pulse 80   Ht 5\' 5"  (1.651 m)   Wt 141 lb (64 kg)   LMP 11/16/2016   BMI 23.46 kg/m  General:   alert  Skin:   no rash or abnormalities  Lungs:   clear to auscultation bilaterally  Heart:   regular rate and rhythm, S1, S2 normal, no murmur, click, rub or gallop  Breasts:   normal without suspicious masses, skin or nipple changes or axillary nodes  Abdomen:  normal findings: no organomegaly, soft, non-tender and no hernia  Pelvis:  External genitalia: normal general appearance Urinary system: urethral meatus normal and bladder without fullness, nontender Vaginal: normal without tenderness, induration or masses Cervix: normal appearance Adnexa: normal bimanual exam Uterus: anteverted and non-tender, normal size   Lab Review Urine pregnancy test Labs reviewed yes Radiologic studies reviewed no  50% of 30 min visit spent on counseling and coordination of care.    Assessment:  Healthy female exam.   1. Well woman exam      NFP methods - Cytology - PAP - Cervicovaginal ancillary only  2. Screening examination for STD (sexually transmitted disease)      Sexually active.  - Cervicovaginal ancillary only - RPR - Hepatitis C antibody - Hepatitis B surface antigen - HIV  antibody  Plan:    Education reviewed: calcium supplements, depression evaluation, low fat, low cholesterol diet, safe sex/STD prevention, self breast exams, skin cancer screening and weight bearing exercise. Contraception: rhythm method. Follow up in: 1 year. or as needed.

## 2016-12-07 NOTE — Progress Notes (Signed)
Pt states she would like STD screening. Last pap 11/21/15-Negative results

## 2016-12-08 LAB — HEPATITIS C ANTIBODY

## 2016-12-08 LAB — HIV ANTIBODY (ROUTINE TESTING W REFLEX): HIV Screen 4th Generation wRfx: NONREACTIVE

## 2016-12-08 LAB — HEPATITIS B SURFACE ANTIGEN: HEP B S AG: NEGATIVE

## 2016-12-08 LAB — CERVICOVAGINAL ANCILLARY ONLY
BACTERIAL VAGINITIS: NEGATIVE
CHLAMYDIA, DNA PROBE: NEGATIVE
Candida vaginitis: NEGATIVE
NEISSERIA GONORRHEA: NEGATIVE
TRICH (WINDOWPATH): NEGATIVE

## 2016-12-08 LAB — RPR: RPR Ser Ql: NONREACTIVE

## 2016-12-14 ENCOUNTER — Other Ambulatory Visit: Payer: Self-pay | Admitting: Certified Nurse Midwife

## 2016-12-14 LAB — CYTOLOGY - PAP: Diagnosis: NEGATIVE

## 2017-02-25 ENCOUNTER — Encounter (HOSPITAL_COMMUNITY): Payer: Self-pay | Admitting: *Deleted

## 2017-02-25 ENCOUNTER — Ambulatory Visit (HOSPITAL_COMMUNITY)
Admission: EM | Admit: 2017-02-25 | Discharge: 2017-02-25 | Disposition: A | Discharge status: Home / Self Care | Payer: BLUE CROSS/BLUE SHIELD

## 2017-02-25 DIAGNOSIS — K5901 Slow transit constipation: Secondary | ICD-10-CM | POA: Diagnosis not present

## 2017-02-25 NOTE — Discharge Instructions (Signed)
Obtain a bottle of MiraLAX, it comes and generic. Drink 3 glasses of liquid with 1 capful of MiraLAX in each glass within an hour and a half. Just prior to starting the MiraLAX take 2 Ducolax tablets. If, in 6 hours did not have adequate results drink another 2 glasses of MiraLAX. If in the morning he still do not have good results take another 2 glasses of MiraLAX. If your constipation persist he may need to follow-up with a primary care provider or gastroenterologist. Increase fiber in your diet and decrease fast foods and other foods that causes constipation.

## 2017-02-25 NOTE — ED Provider Notes (Signed)
[facility]    CSN: [id number] Arrival date & time: 02/25/17  1642     History   Chief Complaint Chief Complaint  Patient presents with  . Constipation    HPI Tamara Gould is a 26 y.o. female.   26 year old female complaining of a constipation problem for 3 months. She states she has a partial bowel movement 1-2 times per week. More recently she is beginning to have some abdominal cramping. She has tried an enema and a glass of MiraLAX daily this week without adequate response.      History reviewed. No pertinent past medical history.  Patient Active Problem List   Diagnosis Date Noted  . BV (bacterial vaginosis) 11/21/2015  . Abdominal pain in female patient 12/15/2012  . Dysmenorrhea 12/15/2012    Past Surgical History:  Procedure Laterality Date  . WISDOM TOOTH EXTRACTION      OB History    Gravida Para Term Preterm AB Living   0 0 0 0 0 0   SAB TAB Ectopic Multiple Live Births   0 0 0 0         Home Medications    Prior to Admission medications   Not on File    Family History Family History  Problem Relation Age of Onset  . Hypertension Father     Social History Social History  Substance Use Topics  . Smoking status: Never Smoker  . Smokeless tobacco: Never Used  . Alcohol use No     Allergies   Patient has no known allergies.   Review of Systems Review of Systems  Constitutional: Negative.   Respiratory: Negative.   Gastrointestinal: Positive for abdominal pain and constipation. Negative for blood in stool, nausea and vomiting.  Genitourinary: Negative.   Neurological: Negative.   All other systems reviewed and are negative.    Physical Exam Triage Vital Signs ED Triage Vitals [02/25/17 1743]  Enc Vitals Group     BP 121/78     Pulse Rate 100     Resp 15     Temp 98 F (36.7 C)     Temp Source Oral     SpO2 100 %     Weight      Height      Head Circumference      Peak Flow      Pain Score      Pain  Loc      Pain Edu?      Excl. in GC?    No data found.   Updated Vital Signs BP 121/78 (BP Location: Right Arm)   Pulse 100   Temp 98 F (36.7 C) (Oral)   Resp 15   SpO2 100%   Visual Acuity Right Eye Distance:   Left Eye Distance:   Bilateral Distance:    Right Eye Near:   Left Eye Near:    Bilateral Near:     Physical Exam  Constitutional: She is oriented to person, place, and time. She appears well-developed and well-nourished. No distress.  Eyes: EOM are normal.  Neck: Neck supple.  Cardiovascular: Normal rate.   Pulmonary/Chest: Effort normal. No respiratory distress.  Abdominal: Soft. There is no tenderness. There is no rebound and no guarding.  Bowel sounds hypoactive.  Musculoskeletal: She exhibits no edema.  Neurological: She is alert and oriented to person, place, and time. She exhibits normal muscle tone.  Skin: Skin is warm and dry.  Psychiatric: She has a normal mood and affect.  Nursing note and vitals reviewed.    UC Treatments / Results  Labs (all labs ordered are listed, but only abnormal results are displayed) Labs Reviewed - No data to display  EKG  EKG Interpretation None       Radiology No results found.  Procedures Procedures (including critical care time)  Medications Ordered in UC Medications - No data to display   Initial Impression / Assessment and Plan / UC Course  I have reviewed the triage vital signs and the nursing notes.  Pertinent labs & imaging results that were available during my care of the patient were reviewed by me and considered in my medical decision making (see chart for details).     Obtain a bottle of MiraLAX, it comes and generic. Drink 3 glasses of liquid with 1 capful of MiraLAX in each glass within an hour and a half. Just prior to starting the MiraLAX take 2 Ducolax tablets. If, in 6 hours did not have adequate results drink another 2 glasses of MiraLAX. If in the morning he still do not have good  results take another 2 glasses of MiraLAX. If your constipation persist he may need to follow-up with a primary care provider or gastroenterologist. Increase fiber in your diet and decrease fast foods and other foods that causes constipation.   Final Clinical Impressions(s) / UC Diagnoses   Final diagnoses:  Slow transit constipation    New Prescriptions New Prescriptions   No medications on file     Controlled Substance Prescriptions [place] Controlled Substance Registry consulted? Not Applicable   Hayden RasmussenMabe, April Carlyon, NP 02/25/17 [phone]

## 2017-02-25 NOTE — ED Triage Notes (Signed)
Patient reports constipation x 3 months. States that she has bowel movement about once a week. No relief with enema/miralax.   Discussed with patient different diets to help with constipation.

## 2017-03-07 ENCOUNTER — Encounter (HOSPITAL_COMMUNITY): Payer: Self-pay

## 2017-03-07 ENCOUNTER — Emergency Department (HOSPITAL_COMMUNITY)
Admission: EM | Admit: 2017-03-07 | Discharge: 2017-03-07 | Disposition: A | Discharge status: Home / Self Care | Payer: BLUE CROSS/BLUE SHIELD | Attending: Emergency Medicine | Admitting: Emergency Medicine

## 2017-03-07 DIAGNOSIS — Z8759 Personal history of other complications of pregnancy, childbirth and the puerperium: Secondary | ICD-10-CM | POA: Insufficient documentation

## 2017-03-07 DIAGNOSIS — K5900 Constipation, unspecified: Secondary | ICD-10-CM | POA: Insufficient documentation

## 2017-03-07 DIAGNOSIS — Z9889 Other specified postprocedural states: Secondary | ICD-10-CM

## 2017-03-07 DIAGNOSIS — R1084 Generalized abdominal pain: Secondary | ICD-10-CM

## 2017-03-07 DIAGNOSIS — R109 Unspecified abdominal pain: Secondary | ICD-10-CM | POA: Diagnosis present

## 2017-03-07 DIAGNOSIS — K644 Residual hemorrhoidal skin tags: Secondary | ICD-10-CM | POA: Insufficient documentation

## 2017-03-07 LAB — COMPREHENSIVE METABOLIC PANEL
ALBUMIN: 3.8 g/dL (ref 3.5–5.0)
ALT: 9 U/L — ABNORMAL LOW (ref 14–54)
ANION GAP: 7 (ref 5–15)
AST: 13 U/L — AB (ref 15–41)
Alkaline Phosphatase: 36 U/L — ABNORMAL LOW (ref 38–126)
BILIRUBIN TOTAL: 0.6 mg/dL (ref 0.3–1.2)
BUN: <5 mg/dL — ABNORMAL LOW (ref 6–20)
CHLORIDE: 103 mmol/L (ref 101–111)
CO2: 27 mmol/L (ref 22–32)
Calcium: 9.1 mg/dL (ref 8.9–10.3)
Creatinine, Ser: 0.58 mg/dL (ref 0.44–1.00)
GFR calc Af Amer: >60 mL/min (ref >60)
GFR calc non Af Amer: >60 mL/min (ref >60)
GLUCOSE: 97 mg/dL (ref 65–99)
POTASSIUM: 3.9 mmol/L (ref 3.5–5.1)
SODIUM: 137 mmol/L (ref 135–145)
TOTAL PROTEIN: 7 g/dL (ref 6.5–8.1)

## 2017-03-07 LAB — URINALYSIS, ROUTINE W REFLEX MICROSCOPIC
Bilirubin Urine: NEGATIVE
Glucose, UA: NEGATIVE mg/dL
Ketones, ur: NEGATIVE mg/dL
LEUKOCYTES UA: NEGATIVE
NITRITE: NEGATIVE
PROTEIN: NEGATIVE mg/dL
Specific Gravity, Urine: 1.023 (ref 1.005–1.030)
pH: 6 (ref 5.0–8.0)

## 2017-03-07 LAB — CBC
HEMATOCRIT: 37.1 % (ref 36.0–46.0)
HEMOGLOBIN: 12.7 g/dL (ref 12.0–15.0)
MCH: 27.8 pg (ref 26.0–34.0)
MCHC: 34.2 g/dL (ref 30.0–36.0)
MCV: 81.2 fL (ref 78.0–100.0)
Platelets: 222 10*3/uL (ref 150–400)
RBC: 4.57 MIL/uL (ref 3.87–5.11)
RDW: 12.5 % (ref 11.5–15.5)
WBC: 3.8 10*3/uL — ABNORMAL LOW (ref 4.0–10.5)

## 2017-03-07 LAB — I-STAT BETA HCG BLOOD, ED (MC, WL, AP ONLY): HCG, QUANTITATIVE: 540.5 m[IU]/mL — AB (ref <5)

## 2017-03-07 LAB — LIPASE, BLOOD: LIPASE: 24 U/L (ref 11–51)

## 2017-03-07 LAB — HCG, QUANTITATIVE, PREGNANCY: HCG, BETA CHAIN, QUANT, S: 615 m[IU]/mL — AB (ref <5)

## 2017-03-07 MED ORDER — HYDROCORTISONE 2.5 % RE CREA: TOPICAL_CREAM | RECTAL | 0 refills | Status: DC

## 2017-03-07 NOTE — ED Notes (Signed)
Walked patient to the bathroom for urine sample 

## 2017-03-07 NOTE — ED Triage Notes (Signed)
Pt states she has been constipated for several months. She states bowel movements contain mucous and some blood noted. Pt reports taking laxatives with some improvement but constipation returns. Pt denies n/v.

## 2017-03-07 NOTE — ED Provider Notes (Signed)
[facility]   CSN: [id number] Arrival date & time: 03/07/17  1153     History   Chief Complaint Chief Complaint  Patient presents with  . Abdominal Pain  . Rectal Pain    HPI Tamara Gould is a 26 y.o. female.  26yo F who p/w constipation and abdominal pain. Pt went to urgent care on 8/10 with complaint of 3 months of constipation. She was given instructions for how to treat and discharged. She reports taking 3 dolcolax daily for a week; she reports she would have a BM once in the morning during that time but she would continue to have abdominal and/or rectal pain after BM. She reports tenismus, rectal pain, bleeding while trying to defecate, and some episodes of mucus. She admits that she has seen external hemorrhoids on exam. She reports the abdominal pain is right-sided and last week she would usually wake up with it and it would last most of the day. Nothing makes the pain better or worse, no change with food. Last BM was sometime last week. She reports stopping taking all medicines over the past several days because she didn't think they were working. No urinary symptoms, she is currently menstruating. No fevers, nausea or vomiting.   The history is provided by the patient.  Abdominal Pain      History reviewed. No pertinent past medical history.  Patient Active Problem List   Diagnosis Date Noted  . BV (bacterial vaginosis) 11/21/2015  . Abdominal pain in female patient 12/15/2012  . Dysmenorrhea 12/15/2012    Past Surgical History:  Procedure Laterality Date  . WISDOM TOOTH EXTRACTION      OB History    Gravida Para Term Preterm AB Living   0 0 0 0 0 0   SAB TAB Ectopic Multiple Live Births   0 0 0 0         Home Medications    Prior to Admission medications   Medication Sig Start Date End Date Taking? Authorizing Provider  docusate sodium (COLACE) 100 MG capsule Take 100 mg by mouth daily.   Yes [provider]    hydrocortisone (ANUSOL-HC) 2.5 % rectal cream Apply rectally 2 times daily as needed for hemorrhoid pain 03/07/17   Daeshaun Specht, Ambrose [place], MD    Family History Family History  Problem Relation Age of Onset  . Hypertension Father     Social History Social History  Substance Use Topics  . Smoking status: Never Smoker  . Smokeless tobacco: Never Used  . Alcohol use No     Allergies   Patient has no known allergies.   Review of Systems Review of Systems  Gastrointestinal: Positive for abdominal pain.     Physical Exam Updated Vital Signs BP 119/79   Pulse 81   Temp 98.1 F (36.7 C) (Oral)   Resp 16   Ht 5\' 5"  (1.651 m)   Wt 66.2 kg (146 lb)   LMP 03/07/2017 (Within Days)   SpO2 100%   BMI 24.30 kg/m   Physical Exam  Constitutional: She is oriented to person, place, and time. She appears well-developed and well-nourished. No distress.  HENT:  Head: Normocephalic and atraumatic.  Moist mucous membranes  Eyes: Pupils are equal, round, and reactive to light. Conjunctivae are normal.  Neck: Neck supple.  Cardiovascular: Normal rate, regular rhythm and normal heart sounds.   No murmur heard. Pulmonary/Chest: Effort normal and breath sounds normal.  Abdominal: Soft. Bowel sounds are normal. She exhibits  no distension. There is no tenderness.  Genitourinary:  Genitourinary Comments: Small non-thrombosed external hemorrhoid, blood coming from vaginal introitus   Musculoskeletal: She exhibits no edema.  Neurological: She is alert and oriented to person, place, and time.  Fluent speech  Skin: Skin is warm and dry.  Psychiatric: She has a normal mood and affect. Judgment normal.  Nursing note and vitals reviewed.  Chaperone was present during exam.   ED Treatments / Results  Labs (all labs ordered are listed, but only abnormal results are displayed) Labs Reviewed  COMPREHENSIVE METABOLIC PANEL - Abnormal; Notable for the following:       Result Value   BUN <5  (*)    AST 13 (*)    ALT 9 (*)    Alkaline Phosphatase 36 (*)    All other components within normal limits  CBC - Abnormal; Notable for the following:    WBC 3.8 (*)    All other components within normal limits  URINALYSIS, ROUTINE W REFLEX MICROSCOPIC - Abnormal; Notable for the following:    Hgb urine dipstick MODERATE (*)    Bacteria, UA RARE (*)    Squamous Epithelial / LPF 0-5 (*)    All other components within normal limits  HCG, QUANTITATIVE, PREGNANCY - Abnormal; Notable for the following:    hCG, Beta Chain, Quant, S 615 (*)    All other components within normal limits  I-STAT BETA HCG BLOOD, ED ([facility], [facility], [facility] ONLY) - Abnormal; Notable for the following:    I-stat hCG, quantitative 540.5 (*)    All other components within normal limits  LIPASE, BLOOD    EKG  EKG Interpretation None       Radiology No results found.  Procedures Procedures (including critical care time)  Medications Ordered in ED Medications - No data to display   Initial Impression / Assessment and Plan / ED Course  I have reviewed the triage vital signs and the nursing notes.  Pertinent labs & imaging results that were available during my care of the patient were reviewed by me and considered in my medical decision making (see chart for details).    Pt w/ 3 months intermittent abdominal pain and constipation, has intermittently tried OTC medications but reports persistent sx as well as Associated tenesmus, occasional blood and mucus. She was well-appearing on exam with normal vital signs. Abdomen was soft and nontender. She states her last bowel movement was last week but she also admits to me that the last several days she stopped all medications because she thought they were not working. Her lab work shows normal CMP and lipase, reassuring CBC, WBC 3.8, hemoglobin 12.7, platelets 222. Given no focal abdominal tenderness, I doubt acute pathology such as diverticulitis or appendicitis. The patient's  pregnancy test came back positive at Hcg 615. She then admitted that she recently had medical abortion on 8/4-8/5 and started bleeding on 8/5. Her constipation sx began before this time. She also states she had a pelvic US prior to abortion to confirm IUP. Therefore, I do not feel she needs [place] at this time. She already has OBGYN follow up arranged. I explained that some of her constipation problems may be due to constipation of 1st trimester of pregnancy.  I spent a long time educating pt on treatment at home including daily fiber and colace, good hydration, titrating miralax to effect, adding senna if needed, and adding mag citrate for severe constipation. I instructed her to establish care w/ PCP who can  follow her over time for this issue. Discussed return precautions including fever, severe abd pain, heavy rectal bleeding. Pt voiced understanding and was discharged in satisfactory condition.   Final Clinical Impressions(s) / ED Diagnoses   Final diagnoses:  External hemorrhoid  Generalized abdominal pain  Constipation, unspecified constipation type  Status post elective abortion    New Prescriptions Discharge Medication List as of 03/07/2017  5:37 PM    START taking these medications   Details  hydrocortisone (ANUSOL-HC) 2.5 % rectal cream Apply rectally 2 times daily as needed for hemorrhoid pain, Print         Angeleena Dueitt, Ambrose Finland, MD 03/08/17 1213

## 2017-03-07 NOTE — Discharge Instructions (Signed)
1. Take colace 2 capsules and a fiber supplement every day. Drink lots of water. 2. Take miralax daily, 1 capful up to 4 capfuls daily as needed for soft stools. 3. Take senna at night as needed if not improved with miralax. 4.  Take a bottle of magnesium citrate for severe constipation. 5. Follow up with a primary care provider for the constipation problems.  1. Use anusol and Tucks pads for hemorrhoid pain. 2. Avoid long periods of sitting. 3. Soak in warm baths.

## 2017-03-17 ENCOUNTER — Ambulatory Visit (INDEPENDENT_AMBULATORY_CARE_PROVIDER_SITE_OTHER): Payer: BLUE CROSS/BLUE SHIELD | Admitting: Certified Nurse Midwife

## 2017-03-17 VITALS — BP 111/67 | HR 97 | Wt 140.0 lb

## 2017-03-17 DIAGNOSIS — R102 Pelvic and perineal pain: Secondary | ICD-10-CM

## 2017-03-17 DIAGNOSIS — Z8759 Personal history of other complications of pregnancy, childbirth and the puerperium: Secondary | ICD-10-CM

## 2017-03-18 ENCOUNTER — Encounter: Payer: Self-pay | Admitting: Certified Nurse Midwife

## 2017-03-18 ENCOUNTER — Ambulatory Visit (HOSPITAL_COMMUNITY): Admission: RE | Admit: 2017-03-18 | Payer: BLUE CROSS/BLUE SHIELD | Source: Ambulatory Visit

## 2017-03-18 LAB — CMP14+CBC/D/PLT+TSH
A/G RATIO: 1.7 (ref 1.2–2.2)
ALBUMIN: 4.7 g/dL (ref 3.5–5.5)
ALT: 7 IU/L (ref 0–32)
AST: 10 IU/L (ref 0–40)
Alkaline Phosphatase: 51 IU/L (ref 39–117)
BUN / CREAT RATIO: 6 — AB (ref 9–23)
BUN: 4 mg/dL — ABNORMAL LOW (ref 6–20)
Basophils Absolute: 0 10*3/uL (ref 0.0–0.2)
Basos: 0 %
Bilirubin Total: 0.5 mg/dL (ref 0.0–1.2)
CALCIUM: 9.6 mg/dL (ref 8.7–10.2)
CHLORIDE: 100 mmol/L (ref 96–106)
CO2: 22 mmol/L (ref 20–29)
Creatinine, Ser: 0.68 mg/dL (ref 0.57–1.00)
EOS (ABSOLUTE): 0 10*3/uL (ref 0.0–0.4)
EOS: 0 %
GFR calc non Af Amer: 123 mL/min/{1.73_m2} (ref >59)
GFR, EST AFRICAN AMERICAN: 142 mL/min/{1.73_m2} (ref >59)
Globulin, Total: 2.7 g/dL (ref 1.5–4.5)
Glucose: 87 mg/dL (ref 65–99)
HEMATOCRIT: 37 % (ref 34.0–46.6)
HEMOGLOBIN: 12.6 g/dL (ref 11.1–15.9)
Immature Grans (Abs): 0 10*3/uL (ref 0.0–0.1)
Immature Granulocytes: 0 %
LYMPHS ABS: 1 10*3/uL (ref 0.7–3.1)
Lymphs: 11 %
MCH: 27.5 pg (ref 26.6–33.0)
MCHC: 34.1 g/dL (ref 31.5–35.7)
MCV: 81 fL (ref 79–97)
MONOCYTES: 10 %
Monocytes Absolute: 0.9 10*3/uL (ref 0.1–0.9)
NEUTROS ABS: 7 10*3/uL (ref 1.4–7.0)
Neutrophils: 79 %
POTASSIUM: 4.5 mmol/L (ref 3.5–5.2)
Platelets: 245 10*3/uL (ref 150–379)
RBC: 4.59 x10E6/uL (ref 3.77–5.28)
RDW: 13.6 % (ref 12.3–15.4)
Sodium: 139 mmol/L (ref 134–144)
TSH: 0.923 u[IU]/mL (ref 0.450–4.500)
Total Protein: 7.4 g/dL (ref 6.0–8.5)
WBC: 8.9 10*3/uL (ref 3.4–10.8)

## 2017-03-18 LAB — BETA HCG QUANT (REF LAB): hCG Quant: 215 m[IU]/mL

## 2017-03-18 MED ORDER — AMOXICILLIN-POT CLAVULANATE 875-125 MG PO TABS: 1.0000 | ORAL_TABLET | Freq: Two times a day (BID) | ORAL | 0 refills | Status: DC

## 2017-03-18 MED ORDER — AZITHROMYCIN 250 MG PO TABS: 250.0000 mg | ORAL_TABLET | Freq: Every day | ORAL | 0 refills | Status: DC

## 2017-03-18 NOTE — Progress Notes (Signed)
Patient ID: Tamara SquibbMonique Gould, female   DOB: 01-15-1991, 26 y.o.   MRN: [id number]  Chief Complaint  Patient presents with  . Gynecologic Exam    had TAB on 02-20-17 did not have f/u at that office. pt states she had bleeding for 2 weeks after procedure up until last week. ? need for repeat HCG today, was 615 on 03/07/17.  pt was recently seen in ED due to severe constipation. pt states it is better now. pt is still having severe abd pain.    HPI Tamara SquibbMonique Fenstermacher is a 26 y.o. female.  [place] in [place] at [facility].  Did not want abortion but [facility] did and paid for it.  Has been having a lot of abdominal pain since and problems with constipation and hemorrhoids with multiple ED visits for these complaints.  Did not have D&E, did Cytotec at home on the 5th & 6th of August.  States that she passed some clots, had red/brown bleeding and stopped bleeding last week.  Rates her abdominal pain 10/10.  She states that she was about 238w2d at the start of August by US done at [facility].  Denies any fever, N&V.   Same partner as before.  Desires birth control.  Birth control options discussed.  Desires to get on birth control pills.    HPI  History reviewed. No pertinent past medical history.  Past Surgical History:  Procedure Laterality Date  . WISDOM TOOTH EXTRACTION      Family History  Problem Relation Age of Onset  . Hypertension Father     Social History Social History  Substance Use Topics  . Smoking status: Never Smoker  . Smokeless tobacco: Never Used  . Alcohol use No    No Known Allergies  Current Outpatient Prescriptions  Medication Sig Dispense Refill  . docusate sodium (COLACE) 100 MG capsule Take 100 mg by mouth daily.    Marland Kitchen. amoxicillin-clavulanate (AUGMENTIN) 875-125 MG tablet Take 1 tablet by mouth 2 (two) times daily. 28 tablet 0  . azithromycin (ZITHROMAX) 250 MG tablet Take 1 tablet (250 mg total) by mouth daily. 6 tablet 0  . hydrocortisone (ANUSOL-HC) 2.5 % rectal  cream Apply rectally 2 times daily as needed for hemorrhoid pain 28.35 g 0   No current facility-administered medications for this visit.     Review of Systems Review of Systems Constitutional: negative for fatigue and weight loss Respiratory: negative for cough and wheezing Cardiovascular: negative for chest pain, fatigue and palpitations Gastrointestinal: + for abdominal pain and change in bowel habits Genitourinary:negative Integument/breast: negative for nipple discharge Musculoskeletal:negative for myalgias Neurological: negative for gait problems and tremors Behavioral/Psych: negative for abusive relationship, depression Endocrine: negative for temperature intolerance      Blood pressure 111/67, pulse 97, weight 140 lb (63.5 kg), last menstrual period 03/07/2017.  Physical Exam Physical Exam General:   alert  Skin:   no rash or abnormalities  Lungs:   clear to auscultation bilaterally  Heart:   regular rate and rhythm, S1, S2 normal, no murmur, click, rub or gallop  Breasts:   deferred  Abdomen:  normal findings: no organomegaly, and no hernia.  Entire abdomen tender to palpation, hard from umbilicus down to syphysis pubis.   Pelvis: Unable to tolerate exam    50% of 30 min visit spent on counseling and coordination of care.    Data Reviewed Previous medical hx, meds, labs  Assessment     Recent abortion ?incomplete vs possible PID Contraception counseling.  Plan   Antibiotics for possible PID: Z-pack, Augmentin X14 days.  Orders Placed This Encounter  Procedures  . US Pelvis Complete    Standing Status:   Future    Standing Expiration Date:   05/17/2018    Order Specific Question:   Reason for Exam (SYMPTOM  OR DIAGNOSIS REQUIRED)    Answer:   postabortion 02/20/17, stopped bleeding last week, severe abdominal pain/cramping still    Order Specific Question:   Preferred imaging location?    Answer:   [facility]  . US Transvaginal Non-OB    Standing  Status:   Future    Standing Expiration Date:   05/17/2018    Order Specific Question:   Reason for Exam (SYMPTOM  OR DIAGNOSIS REQUIRED)    Answer:   postabortion 02/20/17, stopped bleeding last week, severe abdominal pain/cramping still    Order Specific Question:   Preferred imaging location?    Answer:   [facility]  . Beta HCG, Quant  . CMP14+CBC/D/Plt+TSH     Need to obtain previous records for abortion services Possible management options include: D&E if retained products, LARK, wants OCPs.  Follow up within 1 week.  [place] scheduled for 03/18/17.

## 2017-05-18 ENCOUNTER — Ambulatory Visit: Payer: Managed Care, Other (non HMO) | Admitting: Family Medicine

## 2018-01-04 ENCOUNTER — Ambulatory Visit: Payer: Managed Care, Other (non HMO) | Admitting: Family Medicine

## 2018-01-05 ENCOUNTER — Encounter: Payer: Self-pay | Admitting: Family Medicine

## 2018-01-09 ENCOUNTER — Encounter: Payer: Self-pay | Admitting: *Deleted

## 2018-01-13 ENCOUNTER — Encounter: Payer: Self-pay | Admitting: Family Medicine

## 2018-01-13 ENCOUNTER — Other Ambulatory Visit: Payer: Self-pay

## 2018-01-13 ENCOUNTER — Ambulatory Visit (INDEPENDENT_AMBULATORY_CARE_PROVIDER_SITE_OTHER): Payer: Medicaid Other | Admitting: Family Medicine

## 2018-01-13 VITALS — BP 110/76 | HR 74 | Temp 97.8°F | Resp 18 | Ht 65.0 in | Wt 149.0 lb

## 2018-01-13 DIAGNOSIS — K5900 Constipation, unspecified: Secondary | ICD-10-CM

## 2018-01-13 DIAGNOSIS — Z7689 Persons encountering health services in other specified circumstances: Secondary | ICD-10-CM | POA: Diagnosis not present

## 2018-01-13 DIAGNOSIS — R1084 Generalized abdominal pain: Secondary | ICD-10-CM

## 2018-01-13 DIAGNOSIS — Z0000 Encounter for general adult medical examination without abnormal findings: Secondary | ICD-10-CM

## 2018-01-13 LAB — COMPLETE METABOLIC PANEL WITH GFR
AG RATIO: 1.5 (calc) (ref 1.0–2.5)
ALKALINE PHOSPHATASE (APISO): 40 U/L (ref 33–115)
ALT: 4 U/L — ABNORMAL LOW (ref 6–29)
AST: 10 U/L (ref 10–30)
Albumin: 4.4 g/dL (ref 3.6–5.1)
BUN: 10 mg/dL (ref 7–25)
CHLORIDE: 104 mmol/L (ref 98–110)
CO2: 26 mmol/L (ref 20–32)
Calcium: 9.2 mg/dL (ref 8.6–10.2)
Creat: 0.61 mg/dL (ref 0.50–1.10)
GFR, Est African American: 146 mL/min/{1.73_m2} (ref >=60)
GFR, Est Non African American: 126 mL/min/{1.73_m2} (ref >=60)
GLOBULIN: 3 g/dL (ref 1.9–3.7)
Glucose, Bld: 85 mg/dL (ref 65–99)
POTASSIUM: 5.2 mmol/L (ref 3.5–5.3)
Sodium: 137 mmol/L (ref 135–146)
Total Bilirubin: 0.3 mg/dL (ref 0.2–1.2)
Total Protein: 7.4 g/dL (ref 6.1–8.1)

## 2018-01-13 LAB — CBC WITH DIFFERENTIAL/PLATELET
BASOS ABS: 11 {cells}/uL (ref 0–200)
Basophils Relative: 0.3 %
Eosinophils Absolute: 29 cells/uL (ref 15–500)
Eosinophils Relative: 0.8 %
HEMATOCRIT: 37.2 % (ref 35.0–45.0)
Hemoglobin: 12.1 g/dL (ref 11.7–15.5)
LYMPHS ABS: 1235 {cells}/uL (ref 850–3900)
MCH: 25.7 pg — ABNORMAL LOW (ref 27.0–33.0)
MCHC: 32.5 g/dL (ref 32.0–36.0)
MCV: 79.1 fL — AB (ref 80.0–100.0)
MPV: 10.8 fL (ref 7.5–12.5)
Monocytes Relative: 8.7 %
NEUTROS PCT: 55.9 %
Neutro Abs: 2012 cells/uL (ref 1500–7800)
Platelets: 190 10*3/uL (ref 140–400)
RBC: 4.7 10*6/uL (ref 3.80–5.10)
RDW: 13.9 % (ref 11.0–15.0)
TOTAL LYMPHOCYTE: 34.3 %
WBC: 3.6 10*3/uL — ABNORMAL LOW (ref 3.8–10.8)
WBCMIX: 313 {cells}/uL (ref 200–950)

## 2018-01-13 LAB — URINALYSIS, ROUTINE W REFLEX MICROSCOPIC
BACTERIA UA: NONE SEEN /HPF
Bilirubin Urine: NEGATIVE
GLUCOSE, UA: NEGATIVE
Ketones, ur: NEGATIVE
LEUKOCYTES UA: NEGATIVE
Nitrite: NEGATIVE
PROTEIN: NEGATIVE
Specific Gravity, Urine: 1.02 (ref 1.001–1.03)
WBC UA: NONE SEEN /HPF (ref 0–5)
pH: 6.5 (ref 5.0–8.0)

## 2018-01-13 LAB — MICROSCOPIC MESSAGE

## 2018-01-13 LAB — LIPID PANEL
CHOLESTEROL: 135 mg/dL (ref <200)
HDL: 52 mg/dL (ref >50)
LDL Cholesterol (Calc): 75 mg/dL (calc)
Non-HDL Cholesterol (Calc): 83 mg/dL (calc) (ref <130)
Total CHOL/HDL Ratio: 2.6 (calc) (ref <5.0)
Triglycerides: 29 mg/dL (ref <150)

## 2018-01-13 LAB — PREGNANCY, URINE: Preg Test, Ur: NEGATIVE

## 2018-01-13 MED ORDER — POLYETHYLENE GLYCOL 3350 17 GM/SCOOP PO POWD: 17.0000 g | Freq: Every day | ORAL | 1 refills | Status: DC

## 2018-01-13 NOTE — Progress Notes (Signed)
Patient ID: Tamara Gould, female    DOB: 12/21/1990, 27 y.o.   MRN: [id number]  PCP: Tamara Berry, PA-C  Chief Complaint  Patient presents with  . Annual Exam    Subjective:   Tamara Gould is a 27 y.o. female, presents to clinic with CC of abdominal pain and is here to establish care. New to establish pt - has not had a PCP since pediatrician  Right lower quad pain, severe "excuciating pain" sharp, occurs at night when laying down, does sometime feel like its radiating down right side of body.  Happened nightly in June, was constipated and   Associated bloating, no gassiness, no indigestion, no reflux, no V (occassional N with it)  Attempts to adjust diet - drinking more water and eating more fruit Probiotics have helped but she wants to stop them and have bowels be normal Had tried dolculax (one time) and miralax (one capfull).  No vaginal sx, no urinary sx.  No fever, chills, sweats, rash, no sick contacts.  G1P0010 Birth control now, does not want any 2017 stopped depoprovera - on it for 5 years One partner in the last year, not having sex currently but has in the last month. No sx, no need for STD testing, periods regular   Patient Active Problem List   Diagnosis Date Noted  . BV (bacterial vaginosis) 11/21/2015  . Abdominal pain in female patient 12/15/2012  . Dysmenorrhea 12/15/2012     Prior to Admission medications   Medication Sig Start Date End Date Taking? Authorizing Provider  Probiotic Product (PROBIOTIC DAILY PO) Take by mouth.   Yes [provider]     No Known Allergies   Family History  Problem Relation Age of Onset  . Hypertension Father   . Anxiety disorder Sister   . Depression Brother      Social History   Socioeconomic History  . Marital status: Single    Spouse name: Not on file  . Number of children: Not on file  . Years of education: Not on file  . Highest education level: Not on file  Occupational History  .  Not on file  Social Needs  . Financial resource strain: Not on file  . Food insecurity:    Worry: Not on file    Inability: Not on file  . Transportation needs:    Medical: Not on file    Non-medical: Not on file  Tobacco Use  . Smoking status: Never Smoker  . Smokeless tobacco: Never Used  Substance and Sexual Activity  . Alcohol use: No    Alcohol/week: 0.0 oz  . Drug use: No  . Sexual activity: Not Currently    Partners: Male    Birth control/protection: None    Comment: Last intercourse early june this month no birth control methods  Lifestyle  . Physical activity:    Days per week: 2 days    Minutes per session: 60 min  . Stress: Not on file  Relationships  . Social connections:    Talks on phone: Not on file    Gets together: Not on file    Attends religious service: Not on file    Active member of club or organization: Not on file    Attends meetings of clubs or organizations: Not on file    Relationship status: Not on file  . Intimate partner violence:    Fear of current or ex partner: Not on file    Emotionally abused: Not on  file    Physically abused: Not on file    Forced sexual activity: Not on file  Other Topics Concern  . Not on file  Social History Narrative   Lives at home with parents and brother     Review of Systems  Constitutional: Negative.   HENT: Negative.   Eyes: Negative.   Cardiovascular: Negative.   Endocrine: Negative.   Genitourinary: Negative.   Musculoskeletal: Negative.   Skin: Negative.   Allergic/Immunologic: Negative.   Neurological: Negative.   Hematological: Negative.   All other systems reviewed and are negative.      Objective:    Vitals:   01/13/18 0811  BP: 110/76  Pulse: 74  Resp: 18  Temp: 97.8 F (36.6 C)  TempSrc: Oral  SpO2: 99%  Weight: 149 lb (67.6 kg)  Height: 5\' 5"  (1.651 m)      Physical Exam  Constitutional: She is oriented to person, place, and time. She appears well-developed and  well-nourished.  Non-toxic appearance. No distress.  HENT:  Head: Normocephalic and atraumatic.  Right Ear: External ear normal.  Left Ear: External ear normal.  Nose: Nose normal.  Mouth/Throat: Uvula is midline, oropharynx is clear and moist and mucous membranes are normal.  Eyes: Pupils are equal, round, and reactive to light. Conjunctivae, EOM and lids are normal.  Neck: Normal range of motion and phonation normal. Neck supple. No tracheal deviation present.  Cardiovascular: Normal rate, regular rhythm, normal heart sounds and normal pulses. Exam reveals no gallop and no friction rub.  No murmur heard. Pulses:      Radial pulses are 2+ on the right side, and 2+ on the left side.       Posterior tibial pulses are 2+ on the right side, and 2+ on the left side.  Pulmonary/Chest: Effort normal and breath sounds normal. No stridor. No respiratory distress. She has no wheezes. She has no rhonchi. She has no rales. She exhibits no tenderness.  Abdominal: Soft. Normal appearance and bowel sounds are normal. She exhibits no distension. There is no tenderness. There is no rebound and no guarding.  abd soft, normal BS x 4, no CVA tenderness, abd mildly tympanic  Musculoskeletal: Normal range of motion. She exhibits no edema or deformity.  Lymphadenopathy:    She has no cervical adenopathy.  Neurological: She is alert and oriented to person, place, and time. She exhibits normal muscle tone. Gait normal.  Skin: Skin is warm, dry and intact. Capillary refill takes less than 2 seconds. No rash noted. She is not diaphoretic. No pallor.  Psychiatric: She has a normal mood and affect. Her speech is normal and behavior is normal.      UA normal blood likely from menses     Assessment & Plan:      ICD-10-CM   1. Generalized abdominal pain R10.84 CBC with Differential/Platelet    COMPLETE METABOLIC PANEL WITH GFR    Lipid panel    Urinalysis, Routine w reflex microscopic    Pregnancy, urine     DG Abd 1 View    Lipase    Microscopic Message  2. Constipation, unspecified constipation type K59.00 Probiotic Product (PROBIOTIC DAILY PO)    polyethylene glycol powder (GLYCOLAX/MIRALAX) powder    DG Abd 1 View  3. Encounter to establish care with new doctor Z76.89   4. General medical exam Z00.00 CBC with Differential/Platelet    COMPLETE METABOLIC PANEL WITH GFR    Urinalysis, Routine w reflex microscopic  New to establish pt, presents with abdominal pain.  Suspect constipation/IBS.  Will obtain basic labs, UA, urine preg, and KUB.   Pt will return for CPE Will request ped's records   Tamara Berry, PA-C 01/17/18 3:28 AM

## 2018-01-13 NOTE — Patient Instructions (Addendum)
We will call you with basic lab work results [facility]  Do a bowel cleanse with miralax - see printed instructions Then afterwards use miralax 1/2 to 1 capful a day and gradually increase the amount until having soft daily stools.  See diet info below  Call us if you are not improving.  Call us if you are having any worsening!    Constipation, Adult Constipation is when a person:  Poops (has a bowel movement) fewer times in a week than normal.  Has a hard time pooping.  Has poop that is dry, hard, or bigger than normal.  Follow these instructions at home: Eating and drinking   Eat foods that have a lot of fiber, such as: ? Fresh fruits and vegetables. ? Whole grains. ? Beans.  Eat less of foods that are high in fat, low in fiber, or overly processed, such as: ? [place] fries. ? Hamburgers. ? Cookies. ? Candy. ? Soda.  Drink enough fluid to keep your pee (urine) clear or pale yellow. General instructions  Exercise regularly or as told by your doctor.  Go to the restroom when you feel like you need to poop. Do not hold it in.  Take over-the-counter and prescription medicines only as told by your doctor. These include any fiber supplements.  Do pelvic floor retraining exercises, such as: ? Doing deep breathing while relaxing your lower belly (abdomen). ? Relaxing your pelvic floor while pooping.  Watch your condition for any changes.  Keep all follow-up visits as told by your doctor. This is important. Contact a doctor if:  You have pain that gets worse.  You have a fever.  You have not pooped for 4 days.  You throw up (vomit).  You are not hungry.  You lose weight.  You are bleeding from the anus.  You have thin, pencil-like poop (stool). Get help right away if:  You have a fever, and your symptoms suddenly get worse.  You leak poop or have blood in your poop.  Your belly feels hard or bigger than normal (is  bloated).  You have very bad belly pain.  You feel dizzy or you faint. This information is not intended to replace advice given to you by your health care provider. Make sure you discuss any questions you have with your health care provider. Document Released: 12/22/2007 Document Revised: 01/23/2016 Document Reviewed: 12/24/2015 Elsevier Interactive Patient Education  2018 [employer]  Diet for Irritable Bowel Syndrome When you have irritable bowel syndrome (IBS), the foods you eat and your eating habits are very important. IBS may cause various symptoms, such as abdominal pain, constipation, or diarrhea. Choosing the right foods can help ease discomfort caused by these symptoms. Work with your health care provider and dietitian to find the best eating plan to help control your symptoms. What general guidelines do I need to follow?  Keep a food diary. This will help you identify foods that cause symptoms. Write down: ? What you eat and when. ? What symptoms you have. ? When symptoms occur in relation to your meals.  Avoid foods that cause symptoms. Talk with your dietitian about other ways to get the same nutrients that are in these foods.  Eat more foods that contain fiber. Take a fiber supplement if directed by your dietitian.  Eat your meals slowly, in a relaxed setting.  Aim to eat 5-6 small meals per day. Do not skip meals.  Drink enough fluids to keep  your urine clear or pale yellow.  Ask your health care provider if you should take an over-the-counter probiotic during flare-ups to help restore healthy gut bacteria.  If you have cramping or diarrhea, try making your meals low in fat and high in carbohydrates. Examples of carbohydrates are pasta, rice, whole grain breads and cereals, fruits, and vegetables.  If dairy products cause your symptoms to flare up, try eating less of them. You might be able to handle yogurt better than other dairy products because it contains  bacteria that help with digestion. What foods are not recommended? The following are some foods and drinks that may worsen your symptoms:  Fatty foods, such as [place] fries.  Milk products, such as cheese or ice cream.  Chocolate.  Alcohol.  Products with caffeine, such as coffee.  Carbonated drinks, such as soda.  The items listed above may not be a complete list of foods and beverages to avoid. Contact your dietitian for more information. What foods are good sources of fiber? Your health care provider or dietitian may recommend that you eat more foods that contain fiber. Fiber can help reduce constipation and other IBS symptoms. Add foods with fiber to your diet a little at a time so that your body can get used to them. Too much fiber at once might cause gas and swelling of your abdomen. The following are some foods that are good sources of fiber:  Apples.  Peaches.  Pears.  Berries.  Figs.  Broccoli (raw).  Cabbage.  Carrots.  Raw peas.  Kidney beans.  Lima beans.  Whole grain bread.  Whole grain cereal.  Where to find more information: [employer] for Functional Gastrointestinal Disorders: www.iffgd.[employer] of Diabetes and Digestive and Kidney Diseases: http://norris-lawson.com/.aspx This information is not intended to replace advice given to you by your health care provider. Make sure you discuss any questions you have with your health care provider. Document Released: 09/25/2003 Document Revised: 12/11/2015 Document Reviewed: 10/05/2013 Elsevier Interactive Patient Education  2018 [employer].

## 2018-01-14 LAB — LIPASE: LIPASE: 11 U/L (ref 7–60)

## 2018-01-17 NOTE — Progress Notes (Signed)
Labs unremarkable.  Remind to get xray

## 2018-01-31 ENCOUNTER — Ambulatory Visit
Admission: RE | Admit: 2018-01-31 | Discharge: 2018-01-31 | Disposition: A | Discharge status: Home / Self Care | Payer: Medicaid Other | Source: Ambulatory Visit | Attending: Family Medicine | Admitting: Family Medicine

## 2018-02-01 NOTE — Progress Notes (Signed)
Please contact pt with results:  Abdominal Xray did show a moderate amount of stool in colon consistent with constipation.  Need to do a bowel cleanse as previously discussed.  (if she doesn't remember she can look up a miralax bowel cleanse) and then keep a stool softener (miralax 1 cap to 1/2 cap a day) or adjust amount to allow her to have a soft stool daily.

## 2018-03-31 ENCOUNTER — Encounter: Payer: Self-pay | Admitting: Gastroenterology

## 2018-04-12 ENCOUNTER — Ambulatory Visit: Payer: Medicaid Other | Admitting: Gastroenterology

## 2018-04-13 ENCOUNTER — Encounter: Payer: Self-pay | Admitting: Gastroenterology

## 2018-04-13 ENCOUNTER — Ambulatory Visit (INDEPENDENT_AMBULATORY_CARE_PROVIDER_SITE_OTHER): Payer: Managed Care, Other (non HMO) | Admitting: Gastroenterology

## 2018-04-13 VITALS — BP 108/70 | HR 81 | Ht 65.0 in | Wt 148.0 lb

## 2018-04-13 DIAGNOSIS — R1084 Generalized abdominal pain: Secondary | ICD-10-CM | POA: Diagnosis not present

## 2018-04-13 DIAGNOSIS — K5900 Constipation, unspecified: Secondary | ICD-10-CM | POA: Diagnosis not present

## 2018-04-13 DIAGNOSIS — K921 Melena: Secondary | ICD-10-CM | POA: Diagnosis not present

## 2018-04-13 DIAGNOSIS — R011 Cardiac murmur, unspecified: Secondary | ICD-10-CM

## 2018-04-13 MED ORDER — NA SULFATE-K SULFATE-MG SULF 17.5-3.13-1.6 GM/177ML PO SOLN: 1.0000 | Freq: Once | ORAL | 0 refills | Status: AC

## 2018-04-13 NOTE — Patient Instructions (Addendum)
If you are age 25 or older, your body mass index should be between 23-30. Your Body mass index is 24.63 kg/m. If this is out of the aforementioned range listed, please consider follow up with your Primary Care Provider.  If you are age 4 or younger, your body mass index should be between 19-25. Your Body mass index is 24.63 kg/m. If this is out of the aformentioned range listed, please consider follow up with your Primary Care Provider.   You have been scheduled for a colonoscopy. Please follow written instructions given to you at your visit today.  Please pick up your prep supplies at the pharmacy within the next 1-3 days. If you use inhalers (even only as needed), please bring them with you on the day of your procedure. Your physician has requested that you go to www.startemmi.com and enter the access code given to you at your visit today. This web site gives a general overview about your procedure. However, you should still follow specific instructions given to you by our office regarding your preparation for the procedure.   You have been scheduled for an abdominal ultrasound at [facility] (1st floor [address] ) on 04/15/2018 at 11:30am. Please arrive 15 minutes prior to your appointment for registration. Make certain not to have anything to eat or drink 6 hours prior to your appointment. Should you need to reschedule your appointment, please contact radiology at [phone]. This test typically takes about 30 minutes to perform.  You will receive a call whenever you have been scheduled with Cardiology.  It was a pleasure to see you today!  Vito Cirigliano, D.O.

## 2018-04-13 NOTE — Progress Notes (Signed)
Chief Complaint: Abdominal pain, constipation   Referring Provider:     Danelle Berry, PA-C    HPI:     Tamara Gould is a 27 y.o. female referred to the Gastroenterology Clinic for evaluation of RUQ pain and constipation.  She states she has had RUQ pain for the last 2 to 3 months, with constipation starting approximately 8 to 9 months ago.  She was seen by her PCM in June 2019 and treated with a trial of MiraLAX.  She took the [facility] for a week with no change in bowel habits and stopped due to nausea and has not resumed or sought follow-up.  Labs at that time notable for normal lipase, CBC with WBC 3.6 (stable from previous) and normal hemoglobin/hematocrit with mildly reduced MCV at 79.1 (previously 81) normal CMP.  Pelvic ultrasound ordered but not yet done.  Abdominal x-ray with moderate stool burden but otherwise nonobstructive bowel gas pattern and no intra-abdominal pathology.  No previous hx of GI issues. Does report BRB a few months ago, described as scant on tissue paper.  She attributed the BRBPR to hemorrhoids, and treated with Preparation H cream with resolution of sxs.    Abdminal pain typically occurs within a few hours of eating, independent of food type, although admittedly hasnt tried to parse that out.  Pain typically lasts 1-2 hours. Improves with heating pad or with BM.   For constipation, she is having 1 BM/week, which is new this year. Was previously 1/day. +straining to have BM. No high fiber diet, fiber supplement, etc. aside from the MiraLAX as above, has not trialed any new medications for the constipation.  No previous EGD or colonoscopy.  Otherwise, no weight loss, nausea, vomiting, fever, chills.  No known family history of CRC, GI malignancy, liver disease, pancreatic disease, or IBD.     Past Medical History:  Diagnosis Date  . Allergy    seasonal  . Depression      Past Surgical History:  Procedure Laterality Date  . WISDOM TOOTH  EXTRACTION     all 4   Family History  Problem Relation Age of Onset  . Hypertension Father   . Anxiety disorder Sister   . Depression Brother   . Colon cancer Neg Hx   . Esophageal cancer Neg Hx    Social History   Tobacco Use  . Smoking status: Never Smoker  . Smokeless tobacco: Never Used  Substance Use Topics  . Alcohol use: No    Alcohol/week: 0.0 standard drinks  . Drug use: No   No current outpatient medications on file.   No current facility-administered medications for this visit.    No Known Allergies   Review of Systems: All systems reviewed and negative except where noted in HPI.     Physical Exam:    Wt Readings from Last 3 Encounters:  04/13/18 148 lb (67.1 kg)  01/13/18 149 lb (67.6 kg)  03/17/17 140 lb (63.5 kg)    BP 108/70   Pulse 81   Ht 5\' 5"  (1.651 m)   Wt 148 lb (67.1 kg)   BMI 24.63 kg/m  Constitutional:  Pleasant, in no acute distress. Psychiatric: Normal mood and affect. Behavior is normal. EENT: Pupils normal.  Conjunctivae are normal. No scleral icterus. Neck supple. No cervical LAD. Cardiovascular: Normal rate, regular rhythm. No edema. +SEM 3/6 throughout precordium. Pulmonary/chest: Effort normal and breath sounds normal. No wheezing, rales  or rhonchi. Abdominal: Soft, nondistended, nontender. Bowel sounds active throughout. There are no masses palpable. No hepatomegaly. Neurological: Alert and oriented to person place and time. Skin: Skin is warm and dry. No rashes noted.   ASSESSMENT AND PLAN;   Tamara Gould is a 27 y.o. female presenting with:  1) Constipation: New onset constipation earlier this year without preceding changes in medications, travel, dietary intake, etc.  No improvement with trial of MiraLAX and stopped due to nausea.  Discussed medical management options as well as further diagnostic evaluation and will plan on proceeding as below: - Colonoscopy to evaluate for luminal or mucosal etiology for  constipation - Maintain increased fluid intake - Provided with fiber handout -Discussed trial of medications, but she would prefer diagnostic evaluation before any empiric meds at this time - If colonoscopy unrevealing, can either further evaluate with ARM or sits marker study versus medication trial (i.e. laxative short-term, Colace versus Amitiza, Linzess, etc)  2) Abdominal pain: Suspect 2/2 underlyng constipation, but post prandial nature also suggestive of biliary etiology and will eval as below: - Colonoscopy and tx of constipation as above - Right upper quadrant ultrasound - If colonoscopy unrevealing and right upper quadrant ultrasound unrevealing, and symptoms persist, can consider HIDA scan - If biliary work-up unrevealing and symptoms seem more consistent with GI/luminal etiology, can trial additional medical management  3) Hematochezia: - Suspect benign anorectal etiology (ie, hemorrhoids). No recent bleeding. Will eval further at time of colonoscopy as above  4) Murmur: New murmur noted on exam.  - Cardiology referral  The indications, risks, and benefits of colonoscopy were explained to the patient in detail. Risks include but are not limited to bleeding, perforation, adverse reaction to medications, and cardiopulmonary compromise. Sequelae include but are not limited to the possibility of surgery, hospitalization, and mortality. The patient verbalized understanding and wished to proceed. All questions answered, referred to the scheduler and bowel prep ordered. Further recommendations pending results of the exam.    Shellia Cleverly, DO, FACG  04/13/2018, 3:18 PM   Danelle Berry, PA-C

## 2018-04-15 ENCOUNTER — Ambulatory Visit (HOSPITAL_BASED_OUTPATIENT_CLINIC_OR_DEPARTMENT_OTHER): Payer: Self-pay

## 2018-04-18 ENCOUNTER — Encounter: Payer: Self-pay | Admitting: Obstetrics & Gynecology

## 2018-04-18 ENCOUNTER — Ambulatory Visit (INDEPENDENT_AMBULATORY_CARE_PROVIDER_SITE_OTHER): Payer: 59 | Admitting: Obstetrics & Gynecology

## 2018-04-18 VITALS — BP 125/78 | HR 87 | Wt 149.7 lb

## 2018-04-18 DIAGNOSIS — Z113 Encounter for screening for infections with a predominantly sexual mode of transmission: Secondary | ICD-10-CM | POA: Diagnosis not present

## 2018-04-18 DIAGNOSIS — N938 Other specified abnormal uterine and vaginal bleeding: Secondary | ICD-10-CM

## 2018-04-18 NOTE — Progress Notes (Signed)
Pt is presenting in office today for AUB. Pt states she was on Depo for 5 years and stopped depo in 2017. Pt states she was having regular cycles and then 4-5 months ago she has been experiencing spotting in between periods.

## 2018-04-18 NOTE — Progress Notes (Signed)
Patient ID: Tamara Gould, female   DOB: 11/25/1990, 27 y.o.   MRN: [id number]  No chief complaint on file.   HPI Tamara Gould is a 27 y.o. female. Single P0 here today with the issue of abnormal bleeding. This started 4-5 months ago. She is having intermenstrual bleeding, so almost every day of the month. This has never occurred in the past. She has not found anything that makes this issue better. She feels more blood when she is hot. She bleeds with and sex.    Past Medical History:  Diagnosis Date  . Allergy    seasonal  . Depression   . Heart murmur     Past Surgical History:  Procedure Laterality Date  . WISDOM TOOTH EXTRACTION     all 4    Family History  Problem Relation Age of Onset  . Hypertension Father   . Anxiety disorder Sister   . Depression Brother   . Colon cancer Neg Hx   . Esophageal cancer Neg Hx     Social History Social History   Tobacco Use  . Smoking status: Never Smoker  . Smokeless tobacco: Never Used  Substance Use Topics  . Alcohol use: No    Alcohol/week: 0.0 standard drinks  . Drug use: No    No Known Allergies  No current outpatient medications on file.   No current facility-administered medications for this visit.     Review of Systems Review of Systems  She used depo provera from 2012-2017. She is using withdrawal for contraception.    Blood pressure 125/78, pulse 87, weight 149 lb 11.2 oz (67.9 kg), last menstrual period 04/14/2018.  Physical Exam Physical Exam Breathing, conversing, and ambulating normally Well nourished, well hydrated Black female, no apparent distress Cervix- nulliparous, small amount of bleeding  Data Reviewed Pap normal 5/18  Assessment    AUB/DUB- check TSH, CBC, gyn u/s    Plan   She declines gardasil and flu vaccines Come back 2 weeks She declines other forms of contraception at this point.        Allie Bossier 04/18/2018, 4:22 PM

## 2018-04-19 LAB — CBC
Hematocrit: 34.8 % (ref 34.0–46.6)
Hemoglobin: 11.4 g/dL (ref 11.1–15.9)
MCH: 25.4 pg — ABNORMAL LOW (ref 26.6–33.0)
MCHC: 32.8 g/dL (ref 31.5–35.7)
MCV: 78 fL — AB (ref 79–97)
Platelets: 207 10*3/uL (ref 150–450)
RBC: 4.49 x10E6/uL (ref 3.77–5.28)
RDW: 14.3 % (ref 12.3–15.4)
WBC: 4.2 10*3/uL (ref 3.4–10.8)

## 2018-04-19 LAB — CERVICOVAGINAL ANCILLARY ONLY
Chlamydia: NEGATIVE
Neisseria Gonorrhea: NEGATIVE

## 2018-04-19 LAB — TSH: TSH: 1.38 u[IU]/mL (ref 0.450–4.500)

## 2018-04-19 LAB — HIV ANTIBODY (ROUTINE TESTING W REFLEX): HIV SCREEN 4TH GENERATION: NONREACTIVE

## 2018-04-19 NOTE — Progress Notes (Signed)
Cardiology Office Note:    Date:  04/20/2018   ID:  Tamara Gould, DOB 07/09/1991, MRN [id number]  PCP:  Tamara Berry, PA-C  Cardiologist:  Tamara Herrlich, MD   Referring MD: Tamara Berry, PA-C  ASSESSMENT:    1. Heart murmur   2. RBC microcytosis    PLAN:    In order of problems listed above:  1. Physical exam today with a very faint systolic ejection murmur differential diagnosis is innocent murmur of childhood often seen into late 20s and individuals with a thin chest wall and high flow rates versus mitral valve prolapse that she does have a systolic click.  We discussed further evaluation she agrees to undergo echocardiogram.  If she has significant valvular pathology of by her back to the office if unremarkable or mild prolapse I will see back in the office as needed. 2. With their menstrual irregularity clinically she has iron deficiency and asked to start over-the-counter multivitamin with iron.  She can follow-up with this with her gynecologist.  Next appointment PRN   Medication Adjustments/Labs and Tests Ordered: Current medicines are reviewed at length with the patient today.  Concerns regarding medicines are outlined above.  Orders Placed This Encounter  Procedures  . EKG 12-Lead  . ECHOCARDIOGRAM COMPLETE   No orders of the defined types were placed in this encounter.    Chief Complaint  Patient presents with  . Heart Murmur    History of Present Illness:    Tamara Gould is a 27 y.o. 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  As mentioned she continues to exercise on a regular basis without exercise-induced symptoms.  She has no other congenital heart disease.  She has had no syncope.   Past Medical History:  Diagnosis Date  . Allergy    seasonal  . Depression   . Heart murmur     Past Surgical History:  Procedure Laterality Date  . WISDOM TOOTH EXTRACTION     all 4    Current Medications: No outpatient medications have been marked as taking for the 04/20/18 encounter (Office Visit) with Tamara Daub, MD.     Allergies:   Patient has no known allergies.   Social History   Socioeconomic History  . Marital status: Single    Spouse name: Not on file  . Number of children: 0  . Years of education: Not on file  . Highest education level: Not on file  Occupational History  . Occupation: Lawyer  . Financial resource strain: Not on file  . Food insecurity:    Worry: Not on file    Inability: Not on file  . Transportation needs:    Medical: Not on file    Non-medical: Not on file  Tobacco Use  . Smoking status: Never Smoker  . Smokeless tobacco: Never Used  Substance and Sexual Activity  . Alcohol use: No    Alcohol/week: 0.0 standard drinks  . Drug use: No  . Sexual activity: Yes    Partners: Male    Birth control/protection: None  Lifestyle  . Physical activity:    Days per week: 2 days    Minutes per session: 60 min  . Stress: Not on file  Relationships  . Social connections:    Talks on phone: Not on file    Gets together: Not on file    Attends religious service: Not on file    Active member of club or organization: Not on file    Attends meetings of clubs or organizations:  Not on file    Relationship status: Not on file  Other Topics Concern  . Not on file  Social History Narrative   Lives at home with parents and brother     Family History: The patient's family history includes Anxiety disorder in her sister; Depression in her brother; Hypertension in her father. There is no history of Colon cancer, Esophageal cancer, Heart disease, or Stroke.  ROS:   Review of Systems  Constitution: Negative.  HENT: Negative.   Eyes: Negative.   Cardiovascular: Positive for chest pain, dyspnea on exertion and palpitations.  Respiratory: Positive for shortness of breath (at work) and wheezing.   Endocrine: Negative.   Hematologic/Lymphatic: Negative.   Skin: Negative.   Musculoskeletal: Positive for myalgias.  Gastrointestinal: Positive for abdominal pain, constipation and hemorrhoids.  Genitourinary: Negative.   Neurological: Negative.   Psychiatric/Behavioral: Negative.   Allergic/Immunologic: Negative.    Please see the history of present illness.     All other systems reviewed and are negative.  EKGs/Labs/Other Studies Reviewed:    The following studies were reviewed today:   EKG:  EKG is  ordered today.  The ekg ordered today demonstrates normal for age  Recent Labs: 01/13/2018: ALT 4; BUN 10; Creat 0.61; Potassium 5.2; Sodium 137 04/18/2018: Hemoglobin 11.4; Platelets 207; TSH 1.380  Recent Lipid Panel    Component Value Date/Time   CHOL 135 01/13/2018 0903   TRIG 29 01/13/2018 0903   HDL 52 01/13/2018 0903   CHOLHDL 2.6 01/13/2018 0903   LDLCALC 75 01/13/2018 0903    Physical Exam:    VS:  BP 110/72 (BP Location: Left Arm, Patient Position: Sitting, Cuff Size: Normal)   Pulse 77   Ht 5\' 5"  (1.651 m)   Wt 148 lb 0.6 oz (67.2 kg)   LMP 04/14/2018 (Exact Date)   SpO2 98%   BMI 24.64 kg/m     Wt Readings from Last 3 Encounters:  04/20/18 148 lb 0.6 oz (67.2 kg)  04/18/18 149 lb 11.2 oz (67.9 kg)  04/13/18 148 lb (67.1 kg)     GEN:   Well nourished, well developed in no acute distress HEENT: Normal NECK: No JVD; No carotid bruits LYMPHATICS: No lymphadenopathy CARDIAC: 1/6 systolic murmur  At LLSB and ejection click RRR, no rubs, gallops RESPIRATORY:  Clear to auscultation without rales, wheezing or rhonchi  ABDOMEN: Soft, non-tender, non-distended MUSCULOSKELETAL:  No edema; No deformity  SKIN: Warm and dry NEUROLOGIC:  Alert and oriented x 3 PSYCHIATRIC:  Normal affect     Signed, Tamara Herrlich, MD  04/20/2018 9:55 AM    [facility]

## 2018-04-20 ENCOUNTER — Ambulatory Visit (INDEPENDENT_AMBULATORY_CARE_PROVIDER_SITE_OTHER): Payer: 59 | Admitting: Cardiology

## 2018-04-20 ENCOUNTER — Ambulatory Visit (HOSPITAL_BASED_OUTPATIENT_CLINIC_OR_DEPARTMENT_OTHER)
Admission: RE | Admit: 2018-04-20 | Discharge: 2018-04-20 | Disposition: A | Discharge status: Home / Self Care | Payer: 59 | Source: Ambulatory Visit | Attending: Gastroenterology | Admitting: Gastroenterology

## 2018-04-20 ENCOUNTER — Encounter: Payer: Self-pay | Admitting: Cardiology

## 2018-04-20 VITALS — BP 110/72 | HR 77 | Ht 65.0 in | Wt 148.0 lb

## 2018-04-20 DIAGNOSIS — R011 Cardiac murmur, unspecified: Secondary | ICD-10-CM | POA: Diagnosis not present

## 2018-04-20 DIAGNOSIS — R718 Other abnormality of red blood cells: Secondary | ICD-10-CM | POA: Diagnosis not present

## 2018-04-20 DIAGNOSIS — K824 Cholesterolosis of gallbladder: Secondary | ICD-10-CM | POA: Insufficient documentation

## 2018-04-20 DIAGNOSIS — K5900 Constipation, unspecified: Secondary | ICD-10-CM | POA: Insufficient documentation

## 2018-04-20 DIAGNOSIS — R1084 Generalized abdominal pain: Secondary | ICD-10-CM | POA: Insufficient documentation

## 2018-04-20 DIAGNOSIS — K921 Melena: Secondary | ICD-10-CM | POA: Diagnosis present

## 2018-04-20 NOTE — Patient Instructions (Signed)
Medication Instructions:    Your physician has recommended you make the following change in your medication:  START: Over the counter multivitamin with Iron   Labwork: NONE  Testing/Procedures: You had and EKG today.  Your physician has requested that you have an echocardiogram. Echocardiography is a painless test that uses sound waves to create images of your heart. It provides your doctor with information about the size and shape of your heart and how well your heart's chambers and valves are working. This procedure takes approximately one hour. There are no restrictions for this procedure.    Follow-Up: Your physician recommends that you schedule a follow-up appointment as needed or if symptoms worsen or fail to improve.    Any Other Special Instructions Will Be Listed Below (If Applicable).     If you need a refill on your cardiac medications before your next appointment, please call your pharmacy.

## 2018-04-21 ENCOUNTER — Other Ambulatory Visit (HOSPITAL_BASED_OUTPATIENT_CLINIC_OR_DEPARTMENT_OTHER): Payer: 59

## 2018-04-25 ENCOUNTER — Encounter: Payer: Self-pay | Admitting: Gastroenterology

## 2018-04-27 ENCOUNTER — Ambulatory Visit (HOSPITAL_COMMUNITY): Payer: 59

## 2018-04-28 ENCOUNTER — Ambulatory Visit (HOSPITAL_BASED_OUTPATIENT_CLINIC_OR_DEPARTMENT_OTHER): Admission: RE | Admit: 2018-04-28 | Payer: 59 | Source: Ambulatory Visit

## 2018-05-01 ENCOUNTER — Telehealth: Payer: Self-pay | Admitting: Gastroenterology

## 2018-05-01 NOTE — Telephone Encounter (Signed)
Patient notified that per [facility] the referral coordinator has been out on vacation and they are behind on their referrals.  She is notified that she will be contacted soon.

## 2018-05-01 NOTE — Telephone Encounter (Signed)
Patient states she has not heard from [facility] about scheduling a surgery. Patient just calling to check status of referral.

## 2018-05-05 ENCOUNTER — Telehealth: Payer: Self-pay | Admitting: *Deleted

## 2018-05-05 ENCOUNTER — Encounter: Payer: Self-pay | Admitting: Gastroenterology

## 2018-05-05 NOTE — Telephone Encounter (Signed)
[name],  This pt is cleared for anesthetic care at [facility].  Thanks,  [name] 

## 2018-05-05 NOTE — Telephone Encounter (Signed)
Dr. Remi Haggard and Jonny Ruiz,  This pt is going to have a colonoscopy on 05-09-18.  She had a cardiology visit on 04-20-18 and the cardiologist ordered for her to have an ECHO.  He only recommended a follow up as needed.  She cancelled her ECHO and hasn't rescheduled it yet.  Is she ok to still come for her colonoscopy or wait until after ECHO completed?  Please advise.  Thanks, [employer]

## 2018-05-05 NOTE — Telephone Encounter (Signed)
noted 

## 2018-05-05 NOTE — Telephone Encounter (Signed)
Thanks John! Agreed, this TTE was for evaluation of a murmur. No suspicion of heart failure/reduced EF.   Thanks.   NIKE

## 2018-05-09 ENCOUNTER — Ambulatory Visit (AMBULATORY_SURGERY_CENTER): Payer: 59 | Admitting: Gastroenterology

## 2018-05-09 ENCOUNTER — Telehealth: Payer: Self-pay | Admitting: Gastroenterology

## 2018-05-09 ENCOUNTER — Ambulatory Visit: Payer: 59 | Admitting: Obstetrics and Gynecology

## 2018-05-09 ENCOUNTER — Encounter: Payer: Self-pay | Admitting: Gastroenterology

## 2018-05-09 VITALS — BP 112/81 | HR 72 | Temp 97.3°F | Resp 20 | Ht 65.0 in | Wt 148.0 lb

## 2018-05-09 DIAGNOSIS — R1084 Generalized abdominal pain: Secondary | ICD-10-CM | POA: Diagnosis not present

## 2018-05-09 DIAGNOSIS — K5900 Constipation, unspecified: Secondary | ICD-10-CM

## 2018-05-09 MED ORDER — SODIUM CHLORIDE 0.9 % IV SOLN: 500.0000 mL | Freq: Once | INTRAVENOUS | Status: DC

## 2018-05-09 NOTE — Op Note (Signed)
[facility] Patient Name: Tamara Gould Procedure Date: 05/09/2018 2:56 PM MRN: [id number] Endoscopist: Doristine Locks , MD Age: 27 Referring MD:  Date of Birth: 1990/08/10 Gender: Female Account #: [id number] Procedure:                Colonoscopy Indications:              Abdominal pain in the right upper quadrant,                            Hematochezia, Constipation Medicines:                Monitored Anesthesia Care Procedure:                Pre-Anesthesia Assessment:                           - Prior to the procedure, a History and Physical                            was performed, and patient medications and                            allergies were reviewed. The patient's tolerance of                            previous anesthesia was also reviewed. The risks                            and benefits of the procedure and the sedation                            options and risks were discussed with the patient.                            All questions were answered, and informed consent                            was obtained. Prior Anticoagulants: The patient has                            taken no previous anticoagulant or antiplatelet                            agents. ASA Grade Assessment: II - A patient with                            mild systemic disease. After reviewing the risks                            and benefits, the patient was deemed in                            satisfactory condition to undergo the procedure.  After obtaining informed consent, the colonoscope                            was passed under direct vision. Throughout the                            procedure, the patient's blood pressure, pulse, and                            oxygen saturations were monitored continuously. The                            Model PCF-H190DL [id number]) scope was introduced                            through the anus and advanced to  the the terminal                            ileum. The colonoscopy was performed without                            difficulty. The patient tolerated the procedure                            well. The quality of the bowel preparation was                            adequate. Scope In: 2:58:31 PM Scope Out: 3:09:27 PM Scope Withdrawal Time: 0 hours 8 minutes 24 seconds  Total Procedure Duration: 0 hours 10 minutes 56 seconds  Findings:                 The perianal and digital rectal examinations were                            normal.                           The colon (entire examined portion) appeared normal.                           The retroflexed view of the distal rectum and anal                            verge was normal and showed no anal or rectal                            abnormalities.                           The terminal ileum appeared normal. Complications:            No immediate complications. Estimated Blood Loss:     Estimated blood loss: none. Impression:               - The entire examined colon is normal.                           -  The distal rectum and anal verge are normal on                            retroflexion view.                           - The examined portion of the ileum was normal.                           - No specimens collected. Recommendation:           - Patient has a contact number available for                            emergencies. The signs and symptoms of potential                            delayed complications were discussed with the                            patient. Return to normal activities tomorrow.                            Written discharge instructions were provided to the                            patient.                           - Resume previous diet today.                           - Continue present medications.                           - Use fiber, for example Citrucel, Fibercon, Konsyl                             or Metamucil.                           - Return to GI clinic PRN. Doristine Locks, MD 05/09/2018 3:13:23 PM

## 2018-05-09 NOTE — Patient Instructions (Signed)
Thank you for allowing [place] to care for you today!  Resume normal diet and medications today.  Return to normal activities tomorrow.  Use fiber (Citrucel, FIbercon, Konsyl, Metamucil) daily to see if improvement in symptoms.      YOU HAD AN ENDOSCOPIC PROCEDURE TODAY AT THE [facility]:   Refer to the procedure report that was given to you for any specific questions about what was found during the examination.  If the procedure report does not answer your questions, please call your gastroenterologist to clarify.  If you requested that your care partner not be given the details of your procedure findings, then the procedure report has been included in a sealed envelope for you to review at your convenience later.  YOU SHOULD EXPECT: Some feelings of bloating in the abdomen. Passage of more gas than usual.  Walking can help get rid of the air that was put into your GI tract during the procedure and reduce the bloating. If you had a lower endoscopy (such as a colonoscopy or flexible sigmoidoscopy) you may notice spotting of blood in your stool or on the toilet paper. If you underwent a bowel prep for your procedure, you may not have a normal bowel movement for a few days.  Please Note:  You might notice some irritation and congestion in your nose or some drainage.  This is from the oxygen used during your procedure.  There is no need for concern and it should clear up in a day or so.  SYMPTOMS TO REPORT IMMEDIATELY:   Following lower endoscopy (colonoscopy or flexible sigmoidoscopy):  Excessive amounts of blood in the stool  Significant tenderness or worsening of abdominal pains  Swelling of the abdomen that is new, acute  Fever of 100F or higher    For urgent or emergent issues, a gastroenterologist can be reached at any hour by calling [phone].   DIET:  We do recommend a small meal at first, but then you may proceed to your regular diet.  Drink plenty of fluids but  you should avoid alcoholic beverages for 24 hours.  ACTIVITY:  You should plan to take it easy for the rest of today and you should NOT DRIVE or use heavy machinery until tomorrow (because of the sedation medicines used during the test).    FOLLOW UP: Our staff will call the number listed on your records the next business day following your procedure to check on you and address any questions or concerns that you may have regarding the information given to you following your procedure. If we do not reach you, we will leave a message.  However, if you are feeling well and you are not experiencing any problems, there is no need to return our call.  We will assume that you have returned to your regular daily activities without incident.  If any biopsies were taken you will be contacted by phone or by letter within the next 1-3 weeks.  Please call us at [phone] if you have not heard about the biopsies in 3 weeks.    SIGNATURES/CONFIDENTIALITY: You and/or your care partner have signed paperwork which will be entered into your electronic medical record.  These signatures attest to the fact that that the information above on your After Visit Summary has been reviewed and is understood.  Full responsibility of the confidentiality of this discharge information lies with you and/or your care-partner.

## 2018-05-09 NOTE — Telephone Encounter (Signed)
Called patient back and she is having yellow liquid after throwing up her second bottle of prep. Spoke with Dr. Barron Alvine and advised the patient to come on in for her procedure. Sm

## 2018-05-09 NOTE — Progress Notes (Signed)
Pt's states no medical or surgical changes since previsit or office visit. 

## 2018-05-09 NOTE — Progress Notes (Signed)
Report given to PACU, vss 

## 2018-05-10 ENCOUNTER — Telehealth: Payer: Self-pay

## 2018-05-10 NOTE — Telephone Encounter (Signed)
  Follow up Call-  Call back number 05/09/2018  Post procedure Call Back phone  # [phone]  Permission to leave phone message Yes  Some recent data might be hidden     Patient questions:  Do you have a fever, pain , or abdominal swelling? No. Pain Score  0 *  Have you tolerated food without any problems? Yes.    Have you been able to return to your normal activities? Yes.    Do you have any questions about your discharge instructions: Diet   No. Medications  No. Follow up visit  No.  Do you have questions or concerns about your Care? No.  Actions: * If pain score is 4 or above: No action needed, pain <4.

## 2018-05-23 NOTE — Telephone Encounter (Signed)
Patient was evaluated by [facility] Dr. Andrey Campanile on 05/17/18

## 2018-06-02 ENCOUNTER — Ambulatory Visit: Payer: 59 | Admitting: Obstetrics and Gynecology

## 2018-06-27 ENCOUNTER — Ambulatory Visit: Payer: 59 | Admitting: Obstetrics and Gynecology

## 2018-07-27 ENCOUNTER — Ambulatory Visit: Payer: Self-pay | Admitting: General Surgery

## 2018-07-31 NOTE — Patient Instructions (Signed)
Erskine SquibbMonique Garcon  07/31/2018   Your procedure is scheduled on: 08-10-2018    [facility]     Report to admitting at 5:30AM    Call this number if you have problems the morning of surgery [phone]     Remember: NO SOLID FOOD AFTER MIDNIGHT THE NIGHT PRIOR TO SURGERY. NOTHING BY MOUTH EXCEPT CLEAR LIQUIDS UNTIL 3 HOURS PRIOR TO SCHEULED SURGERY. PLEASE FINISH ENSURE DRINK PER SURGEON ORDER 3 HOURS PRIOR TO SCHEDULED SURGERY TIME WHICH NEEDS TO BE COMPLETED AT ____4:30AM_____. BRUSH YOUR TEETH MORNING OF SURGERY AND RINSE YOUR MOUTH OUT, NO CHEWING GUM CANDY OR MINTS.     CLEAR LIQUID DIET   Foods Allowed                                                                     Foods Excluded  Coffee and tea, regular and decaf                             liquids that you cannot  Plain Jell-O in any flavor                                             see through such as: Fruit ices (not with fruit pulp)                                     milk, soups, orange juice  Iced Popsicles                                    All solid food Carbonated beverages, regular and diet                                    Cranberry, grape and apple juices Sports drinks like Gatorade Lightly seasoned clear broth or consume(fat free) Sugar, honey syrup  Sample Menu Breakfast                                Lunch                                     Supper Cranberry juice                    Beef broth                            Chicken broth Jell-O  Grape juice                           Apple juice Coffee or tea                        Jell-O                                      Popsicle                                                Coffee or tea                        Coffee or tea  _____________________________________________________________________       Take these medicines the morning of surgery with A SIP OF WATER:  NONE                                You may not have any metal on your body including hair pins and              piercings  Do not wear jewelry, make-up, lotions, powders or perfumes, deodorant             Do not wear nail polish.  Do not shave  48 hours prior to surgery.          Do not bring valuables to the hospital. [facility] IS NOT             RESPONSIBLE   FOR VALUABLES.  Contacts, dentures or bridgework may not be worn into surgery.      Patients discharged the day of surgery will not be allowed to drive home. IF YOU ARE HAVING SURGERY AND GOING HOME THE SAME DAY, YOU MUST HAVE AN ADULT TO DRIVE YOU HOME AND BE WITH YOU FOR 24 HOURS. YOU MAY GO HOME BY TAXI OR UBER OR ORTHERWISE, BUT AN ADULT MUST ACCOMPANY YOU HOME AND STAY WITH YOU FOR 24 HOURS.  Name and phone number of your driver:  Special Instructions: N/A              Please read over the following fact sheets you were given: _____________________________________________________________________             [facility] - Preparing for Surgery Before surgery, you can play an important role.  Because skin is not sterile, your skin needs to be as free of germs as possible.  You can reduce the number of germs on your skin by washing with CHG (chlorahexidine gluconate) soap before surgery.  CHG is an antiseptic cleaner which kills germs and bonds with the skin to continue killing germs even after washing. Please DO NOT use if you have an allergy to CHG or antibacterial soaps.  If your skin becomes reddened/irritated stop using the CHG and inform your nurse when you arrive at Short Stay. Do not shave (including legs and underarms) for at least 48 hours prior to the first CHG shower.  You may shave your face/neck. Please follow these instructions carefully:  1.  Shower with CHG Soap the night before surgery  and the  morning of Surgery.  2.  If you choose to wash your hair, wash your hair first as usual with your  normal   shampoo.  3.  After you shampoo, rinse your hair and body thoroughly to remove the  shampoo.                           4.  Use CHG as you would any other liquid soap.  You can apply chg directly  to the skin and wash                       Gently with a scrungie or clean washcloth.  5.  Apply the CHG Soap to your body ONLY FROM THE NECK DOWN.   Do not use on face/ open                           Wound or open sores. Avoid contact with eyes, ears mouth and genitals (private parts).                       Wash face,  Genitals (private parts) with your normal soap.             6.  Wash thoroughly, paying special attention to the area where your surgery  will be performed.  7.  Thoroughly rinse your body with warm water from the neck down.  8.  DO NOT shower/wash with your normal soap after using and rinsing off  the CHG Soap.                9.  Pat yourself dry with a clean towel.            10.  Wear clean pajamas.            11.  Place clean sheets on your bed the night of your first shower and do not  sleep with pets. Day of Surgery : Do not apply any lotions/deodorants the morning of surgery.  Please wear clean clothes to the hospital/surgery center.  FAILURE TO FOLLOW THESE INSTRUCTIONS MAY RESULT IN THE CANCELLATION OF YOUR SURGERY PATIENT SIGNATURE_________________________________  NURSE SIGNATURE__________________________________  ________________________________________________________________________

## 2018-07-31 NOTE — Progress Notes (Addendum)
lov cardiology Dr Norman Herrlich 04-20-18 epic

## 2018-08-01 ENCOUNTER — Encounter (HOSPITAL_COMMUNITY): Payer: Self-pay

## 2018-08-01 ENCOUNTER — Encounter (HOSPITAL_COMMUNITY)
Admission: RE | Admit: 2018-08-01 | Discharge: 2018-08-01 | Disposition: A | Discharge status: Home / Self Care | Payer: 59 | Source: Ambulatory Visit | Attending: General Surgery | Admitting: General Surgery

## 2018-08-01 ENCOUNTER — Other Ambulatory Visit: Payer: Self-pay

## 2018-08-01 DIAGNOSIS — K8020 Calculus of gallbladder without cholecystitis without obstruction: Secondary | ICD-10-CM | POA: Diagnosis not present

## 2018-08-01 DIAGNOSIS — Z01812 Encounter for preprocedural laboratory examination: Secondary | ICD-10-CM | POA: Insufficient documentation

## 2018-08-01 HISTORY — DX: Calculus of gallbladder without cholecystitis without obstruction: K80.20

## 2018-08-01 LAB — CBC WITH DIFFERENTIAL/PLATELET
Abs Immature Granulocytes: 0.01 10*3/uL (ref 0.00–0.07)
Basophils Absolute: 0 10*3/uL (ref 0.0–0.1)
Basophils Relative: 1 %
Eosinophils Absolute: 0 10*3/uL (ref 0.0–0.5)
Eosinophils Relative: 1 %
HCT: 36.9 % (ref 36.0–46.0)
Hemoglobin: 11 g/dL — ABNORMAL LOW (ref 12.0–15.0)
Immature Granulocytes: 0 %
LYMPHS ABS: 1.1 10*3/uL (ref 0.7–4.0)
Lymphocytes Relative: 36 %
MCH: 23.7 pg — AB (ref 26.0–34.0)
MCHC: 29.8 g/dL — ABNORMAL LOW (ref 30.0–36.0)
MCV: 79.5 fL — AB (ref 80.0–100.0)
Monocytes Absolute: 0.3 10*3/uL (ref 0.1–1.0)
Monocytes Relative: 10 %
Neutro Abs: 1.6 10*3/uL — ABNORMAL LOW (ref 1.7–7.7)
Neutrophils Relative %: 52 %
Platelets: 215 10*3/uL (ref 150–400)
RBC: 4.64 MIL/uL (ref 3.87–5.11)
RDW: 13.3 % (ref 11.5–15.5)
WBC: 3.1 10*3/uL — ABNORMAL LOW (ref 4.0–10.5)
nRBC: 0 % (ref 0.0–0.2)

## 2018-08-01 LAB — COMPREHENSIVE METABOLIC PANEL
ALT: 10 U/L (ref 0–44)
AST: 15 U/L (ref 15–41)
Albumin: 4.6 g/dL (ref 3.5–5.0)
Alkaline Phosphatase: 37 U/L — ABNORMAL LOW (ref 38–126)
Anion gap: 6 (ref 5–15)
BUN: 8 mg/dL (ref 6–20)
CO2: 27 mmol/L (ref 22–32)
CREATININE: 0.49 mg/dL (ref 0.44–1.00)
Calcium: 8.9 mg/dL (ref 8.9–10.3)
Chloride: 106 mmol/L (ref 98–111)
GFR calc non Af Amer: >60 mL/min (ref >60)
Glucose, Bld: 92 mg/dL (ref 70–99)
Potassium: 4.9 mmol/L (ref 3.5–5.1)
Sodium: 139 mmol/L (ref 135–145)
Total Bilirubin: 0.6 mg/dL (ref 0.3–1.2)
Total Protein: 7.7 g/dL (ref 6.5–8.1)

## 2018-08-01 LAB — HCG, SERUM, QUALITATIVE: Preg, Serum: NEGATIVE

## 2018-08-09 ENCOUNTER — Ambulatory Visit: Payer: Self-pay | Admitting: General Surgery

## 2018-08-09 ENCOUNTER — Encounter (HOSPITAL_COMMUNITY): Payer: Self-pay | Admitting: Anesthesiology

## 2018-08-09 NOTE — Anesthesia Preprocedure Evaluation (Addendum)
Anesthesia Evaluation  Patient identified by MRN, date of birth, ID band Patient awake    Reviewed: Allergy & Precautions, NPO status , Patient's Chart, lab work & pertinent test results  Airway Mallampati: II  TM Distance: >3 FB Neck ROM: Full    Dental  (+) Teeth Intact, Dental Advisory Given   Pulmonary neg pulmonary ROS,    breath sounds clear to auscultation       Cardiovascular negative cardio ROS   Rhythm:Regular Rate:Normal     Neuro/Psych Depression negative neurological ROS     GI/Hepatic negative GI ROS, Neg liver ROS,   Endo/Other  negative endocrine ROS  Renal/GU negative Renal ROS     Musculoskeletal   Abdominal Normal abdominal exam  (+)   Peds  Hematology negative hematology ROS (+)   Anesthesia Other Findings   Reproductive/Obstetrics                            Anesthesia Physical Anesthesia Plan  ASA: II  Anesthesia Plan: General   Post-op Pain Management:    Induction: Intravenous  PONV Risk Score and Plan: 4 or greater and Ondansetron, Dexamethasone, Midazolam and Scopolamine patch - Pre-op  Airway Management Planned: Oral ETT  Additional Equipment: None  Intra-op Plan:   Post-operative Plan: Extubation in OR  Informed Consent: I have reviewed the patients History and Physical, chart, labs and discussed the procedure including the risks, benefits and alternatives for the proposed anesthesia with the patient or authorized representative who has indicated his/her understanding and acceptance.     Dental advisory given  Plan Discussed with: CRNA  Anesthesia Plan Comments:        Anesthesia Quick Evaluation

## 2018-08-09 NOTE — H&P (Signed)
[name] Documented: 05/17/2018 9:17 AM Location: [facility] Patient #: [id number] DOB: June 07, 1991 Single / Language: [place] / Race: Black or African American Female   History of Present Illness Tamara Gould(Devone Tousley M. Silvia Markuson MD; 05/17/2018 9:50 AM) The patient is a 28 year old female who presents with abdominal pain. She is referred by Dr Barron Alvineirigliano for evaluation of a gallbladder polyp. She states that she's been having right-sided abdominal pain for about 3 months. It occurs after eating. She points to her right groin area. It last for about 1 hour. She also has bloating and cramping. She is ALSO had a change in bowel habits. She has been constipated for about 5 months. She is having one bowel movement per week. She also complains of some shooting pain on her right lateral thigh. The discomfort in the right groin she describes as a dull ache. They occur concurrently. Walking helps with the discomfort when she gets it. She denies any unplanned weight loss. She denies any melena or hematochezia. She denies any acholic stools. She reports several members of her family had gallbladder surgery. She denies any GI malignancies. She had an abdominal ultrasound which revealed no gallstones that she had a 1.2 cm gallbladder polyp. She underwent a colonoscopy which was within normal limits. She also had a cardiology evaluation for a murmur. She had an echocardiogram and was cleared for colonoscopy. She denies any jaundice   Problem List/Past Medical Tamara Gould(Eathen Budreau M. Andrey CampanileWilson, MD; 05/18/2018 4:24 PM) GALLBLADDER POLYP (K82.4)  PAIN OF RIGHT LATERAL UPPER THIGH (M79.651)  CONSTIPATION IN FEMALE (K59.00)  RIGHT INGUINAL PAIN (R10.31)   Past Surgical History Tamara Gould(Alberto Pina M. Andrey CampanileWilson, MD; 05/18/2018 4:24 PM) Colon Polyp Removal - Colonoscopy  Oral Surgery   Diagnostic Studies History Tamara Gould(Courtlynn Holloman M. Andrey CampanileWilson, MD; 05/18/2018 4:24 PM) Colonoscopy  within last year Pap Smear  1-5 years  ago  Allergies Maurilio Lovely(Angela Holmes, CMA; 05/17/2018 9:18 AM) No Known Drug Allergies [05/17/2018]: Allergies Reconciled   Medication History Maurilio Lovely(Angela Holmes, CMA; 05/17/2018 9:18 AM) No Current Medications Medications Reconciled  Social History Tamara Gould(Jaaron Oleson M. Andrey CampanileWilson, MD; 05/18/2018 4:24 PM) No alcohol use  No caffeine use  No drug use  Tobacco use  Never smoker.  Family History Tamara Gould(Keane Martelli M. Andrey CampanileWilson, MD; 05/18/2018 4:24 PM) Depression  Brother. Hypertension  Father.  Pregnancy / Birth History Tamara Gould(Finola Rosal M. Andrey CampanileWilson, MD; 05/18/2018 4:24 PM) Age at menarche  13 years. Gravida  1 Maternal age  26-25 Para  0 Regular periods   Other Problems Tamara Gould(Adren Dollins M. Andrey CampanileWilson, MD; 05/18/2018 4:24 PM) Bladder Problems  Chest pain  Heart murmur  Hemorrhoids     Review of Systems Tamara Gould(Dj Senteno M. Dreyton Roessner MD; 05/18/2018 4:24 PM) General Present- Appetite Loss. Not Present- Chills, Fatigue, Fever, Night Sweats, Weight Gain and Weight Loss. Respiratory Not Present- Bloody sputum, Chronic Cough, Difficulty Breathing, Snoring and Wheezing. Gastrointestinal Present- Abdominal Pain, Bloating, Hemorrhoids and Rectal Pain. Not Present- Bloody Stool, Change in Bowel Habits, Chronic diarrhea, Constipation, Difficulty Swallowing, Excessive gas, Gets full quickly at meals, Indigestion, Nausea and Vomiting. Neurological Not Present- Decreased Memory, Fainting, Headaches, Numbness, Seizures, Tingling, Tremor, Trouble walking and Weakness.  Vitals Maurilio Lovely(Angela Holmes CMA; 05/17/2018 9:19 AM) 05/17/2018 9:18 AM Weight: 143.38 lb Height: 65in Body Surface Area: 1.72 m Body Mass Index: 23.86 kg/m  Temp.: 98.67F(Oral)  Pulse: 88 (Regular)  BP: 122/80 (Sitting, Left Arm, Standard)       Physical Exam Tamara Gould(Sharrieff Spratlin M. Khaden Gater MD; 05/17/2018 9:48 AM) General Mental Status-Alert. General Appearance-Consistent with stated age. Hydration-Well hydrated. Voice-Normal.  Head and Neck Head-normocephalic, atraumatic  with no lesions or palpable masses. Trachea-midline. Thyroid Gland Characteristics - normal size and consistency.  Eye Eyeball - Bilateral-Extraocular movements intact. Sclera/Conjunctiva - Bilateral-No scleral icterus.  ENMT Ears -Note: Normal external ears.  Mouth and Throat -Note: Lips intact.   Chest and Lung Exam Chest and lung exam reveals -quiet, even and easy respiratory effort with no use of accessory muscles and on auscultation, normal breath sounds, no adventitious sounds and normal vocal resonance. Inspection Chest Wall - Normal. Back - normal.  Breast - Did not examine.  Cardiovascular Cardiovascular examination reveals -normal heart sounds, regular rate and rhythm with no murmurs and normal pedal pulses bilaterally.  Abdomen Inspection Inspection of the abdomen reveals - No Hernias. Skin - Scar - no surgical scars. Palpation/Percussion Palpation and Percussion of the abdomen reveal - Soft, Non Tender, No Rebound tenderness, No Rigidity (guarding) and No hepatosplenomegaly. Auscultation Auscultation of the abdomen reveals - Bowel sounds normal.  Female Genitourinary Note: Chaperone present. No obvious bulge or right groin. Palpation of the groin with Valsalva reveals no bulge or weakness.   Peripheral Vascular Upper Extremity Palpation - Pulses bilaterally normal.  Neurologic Neurologic evaluation reveals -alert and oriented x 3 with no impairment of recent or remote memory. Mental Status-Normal.  Neuropsychiatric The patient's mood and affect are described as -normal. Judgment and Insight-insight is appropriate concerning matters relevant to self.  Musculoskeletal Normal Exam - Left-Upper Extremity Strength Normal and Lower Extremity Strength Normal. Normal Exam - Right-Upper Extremity Strength Normal and Lower Extremity Strength Normal.  Lymphatic Head & Neck  General Head & Neck Lymphatics: Bilateral - Description -  Normal. Axillary - Did not examine. Femoral & Inguinal - Did not examine.    Assessment & Plan Tamara Gould(Kennedi Lizardo M. Oluwatobiloba Martin MD; 05/17/2018 9:47 AM) GALLBLADDER POLYP (K82.4) Impression: We discussed the finding of the gallbladder follow-up on her ultrasound. Since it is greater than 1 cm in size we discussed that the standard of care is laparoscopic cholecystectomy. We discussed the increased incidence of gallbladder cancer with gallbladder polyps. However there is no concerning radiological feature on her ultrasound. We discussed this the best way to manage her gallbladder polyp at this size.  .  We discussed gallbladder disease. The patient was given educational material.  I discussed laparoscopic cholecystectomy with IOC in detail. The patient was given educational material as well as diagrams detailing the procedure. We discussed the risks and benefits of a laparoscopic cholecystectomy including, but not limited to bleeding, infection, injury to surrounding structures such as the intestine or liver, bile leak, retained gallstones, need to convert to an open procedure, prolonged diarrhea, blood clots such as DVT, common bile duct injury, anesthesia risks, and possible need for additional procedures. We discussed the typical post-operative recovery course.  The patient has elected to proceed with surgery. Current Plans Pt Education - Pamphlet Given - Laparoscopic Gallbladder Surgery: discussed with patient and provided information. RIGHT INGUINAL PAIN (R10.31) Impression: Her right-sided pain is actually in her inguinal area and radiates to her right lateral thigh. I explained that I do not believe cholecystectomy will ameliorate her groin and right lateral thigh pain. I did not detect a hernia on exam. This sounds more like a musculoskeletal or hip issue. We discussed referral to a sports medicine physician. We discussed timing of referral. She wants to see if cholecystectomy may ameliorate her pain. I  don't think this unreasonable. We will wait to see how she recovers from cholecystectomy. If still having  right inguinal and lateral thigh pain afterwards that she will need additional evaluation. PAIN OF RIGHT LATERAL UPPER THIGH (M79.651) CONSTIPATION IN FEMALE (K59.00) Impression: Encouraged follow-up with her gastroenterologist  Mary Sella. Andrey Campanile, MD, FACS General, Bariatric, & Minimally Invasive Surgery [facility], [facility], [facility], [place]

## 2018-08-10 ENCOUNTER — Ambulatory Visit (HOSPITAL_COMMUNITY): Payer: 59 | Admitting: Anesthesiology

## 2018-08-10 ENCOUNTER — Ambulatory Visit (HOSPITAL_COMMUNITY)
Admission: RE | Admit: 2018-08-10 | Discharge: 2018-08-10 | Disposition: A | Discharge status: Home / Self Care | Payer: 59 | Attending: General Surgery | Admitting: General Surgery

## 2018-08-10 ENCOUNTER — Ambulatory Visit (HOSPITAL_COMMUNITY): Payer: 59 | Admitting: Physician Assistant

## 2018-08-10 ENCOUNTER — Other Ambulatory Visit: Payer: Self-pay

## 2018-08-10 ENCOUNTER — Encounter (HOSPITAL_COMMUNITY)
Admission: RE | Disposition: A | Discharge status: Home / Self Care | Payer: Self-pay | Source: Home / Self Care | Attending: General Surgery

## 2018-08-10 ENCOUNTER — Encounter (HOSPITAL_COMMUNITY): Payer: Self-pay | Admitting: *Deleted

## 2018-08-10 DIAGNOSIS — F329 Major depressive disorder, single episode, unspecified: Secondary | ICD-10-CM | POA: Diagnosis not present

## 2018-08-10 DIAGNOSIS — Z79899 Other long term (current) drug therapy: Secondary | ICD-10-CM | POA: Diagnosis not present

## 2018-08-10 DIAGNOSIS — K811 Chronic cholecystitis: Secondary | ICD-10-CM | POA: Insufficient documentation

## 2018-08-10 HISTORY — PX: CHOLECYSTECTOMY: SHX55

## 2018-08-10 SURGERY — LAPAROSCOPIC CHOLECYSTECTOMY WITH INTRAOPERATIVE CHOLANGIOGRAM
Anesthesia: General | Site: Abdomen

## 2018-08-10 MED ORDER — LIDOCAINE 2% (20 MG/ML) 5 ML SYRINGE
INTRAMUSCULAR | Status: DC | PRN
  Administered 2018-08-10: 60 mg via INTRAVENOUS

## 2018-08-10 MED ORDER — HYDROMORPHONE HCL 1 MG/ML IJ SOLN
INTRAMUSCULAR | Status: AC
  Filled 2018-08-10: qty 1

## 2018-08-10 MED ORDER — PROMETHAZINE HCL 25 MG/ML IJ SOLN
INTRAMUSCULAR | Status: AC
  Filled 2018-08-10: qty 1

## 2018-08-10 MED ORDER — ROCURONIUM BROMIDE 100 MG/10ML IV SOLN
INTRAVENOUS | Status: AC
  Filled 2018-08-10: qty 1

## 2018-08-10 MED ORDER — ACETAMINOPHEN 325 MG PO TABS: 325.0000 mg | ORAL_TABLET | Freq: Once | ORAL | Status: DC

## 2018-08-10 MED ORDER — BUPIVACAINE-EPINEPHRINE 0.25% -1:200000 IJ SOLN
INTRAMUSCULAR | Status: DC | PRN
  Administered 2018-08-10: 30 mL

## 2018-08-10 MED ORDER — PROPOFOL 10 MG/ML IV BOLUS
INTRAVENOUS | Status: AC
  Filled 2018-08-10: qty 20

## 2018-08-10 MED ORDER — OXYCODONE HCL 5 MG PO TABS: 5.0000 mg | ORAL_TABLET | Freq: Four times a day (QID) | ORAL | 0 refills | Status: DC | PRN

## 2018-08-10 MED ORDER — CHLORHEXIDINE GLUCONATE CLOTH 2 % EX PADS: 6.0000 | MEDICATED_PAD | Freq: Once | CUTANEOUS | Status: DC

## 2018-08-10 MED ORDER — LACTATED RINGERS IV SOLN
INTRAVENOUS | Status: DC
  Administered 2018-08-10: 06:00:00 via INTRAVENOUS

## 2018-08-10 MED ORDER — ONDANSETRON HCL 4 MG/2ML IJ SOLN
INTRAMUSCULAR | Status: AC
  Filled 2018-08-10: qty 2

## 2018-08-10 MED ORDER — ACETAMINOPHEN 160 MG/5ML PO SOLN
325.0000 mg | Freq: Once | ORAL | Status: DC
  Filled 2018-08-10: qty 20.3

## 2018-08-10 MED ORDER — LIDOCAINE 2% (20 MG/ML) 5 ML SYRINGE
INTRAMUSCULAR | Status: AC
  Filled 2018-08-10: qty 10

## 2018-08-10 MED ORDER — GABAPENTIN 300 MG PO CAPS
300.0000 mg | ORAL_CAPSULE | ORAL | Status: AC
  Administered 2018-08-10: 300 mg via ORAL
  Filled 2018-08-10: qty 1

## 2018-08-10 MED ORDER — 0.9 % SODIUM CHLORIDE (POUR BTL) OPTIME
TOPICAL | Status: DC | PRN
  Administered 2018-08-10: 1000 mL

## 2018-08-10 MED ORDER — DEXAMETHASONE SODIUM PHOSPHATE 10 MG/ML IJ SOLN
INTRAMUSCULAR | Status: AC
  Filled 2018-08-10: qty 1

## 2018-08-10 MED ORDER — PROPOFOL 10 MG/ML IV BOLUS
INTRAVENOUS | Status: DC | PRN
  Administered 2018-08-10: 150 mg via INTRAVENOUS

## 2018-08-10 MED ORDER — ROCURONIUM BROMIDE 100 MG/10ML IV SOLN
INTRAVENOUS | Status: AC
  Filled 2018-08-10: qty 3

## 2018-08-10 MED ORDER — SUCCINYLCHOLINE CHLORIDE 200 MG/10ML IV SOSY
PREFILLED_SYRINGE | INTRAVENOUS | Status: AC
  Filled 2018-08-10: qty 10

## 2018-08-10 MED ORDER — ACETAMINOPHEN 10 MG/ML IV SOLN: 1000.0000 mg | Freq: Once | INTRAVENOUS | Status: DC | PRN

## 2018-08-10 MED ORDER — LACTATED RINGERS IV SOLN: INTRAVENOUS | Status: DC

## 2018-08-10 MED ORDER — LIDOCAINE 2% (20 MG/ML) 5 ML SYRINGE
INTRAMUSCULAR | Status: DC | PRN
  Administered 2018-08-10: 1.5 mg/kg/h via INTRAVENOUS

## 2018-08-10 MED ORDER — ROCURONIUM BROMIDE 50 MG/5ML IV SOSY
PREFILLED_SYRINGE | INTRAVENOUS | Status: DC | PRN
  Administered 2018-08-10: 50 mg via INTRAVENOUS

## 2018-08-10 MED ORDER — MIDAZOLAM HCL 5 MG/5ML IJ SOLN
INTRAMUSCULAR | Status: DC | PRN
  Administered 2018-08-10: 2 mg via INTRAVENOUS

## 2018-08-10 MED ORDER — SCOPOLAMINE 1 MG/3DAYS TD PT72
1.0000 | MEDICATED_PATCH | TRANSDERMAL | Status: DC
  Administered 2018-08-10: 1.5 mg via TRANSDERMAL

## 2018-08-10 MED ORDER — ONDANSETRON HCL 4 MG/2ML IJ SOLN
INTRAMUSCULAR | Status: DC | PRN
  Administered 2018-08-10: 4 mg via INTRAVENOUS

## 2018-08-10 MED ORDER — SUCCINYLCHOLINE CHLORIDE 200 MG/10ML IV SOSY
PREFILLED_SYRINGE | INTRAVENOUS | Status: AC
  Filled 2018-08-10: qty 20

## 2018-08-10 MED ORDER — MIDAZOLAM HCL 2 MG/2ML IJ SOLN
INTRAMUSCULAR | Status: AC
  Filled 2018-08-10: qty 2

## 2018-08-10 MED ORDER — IOPAMIDOL (ISOVUE-300) INJECTION 61%
INTRAVENOUS | Status: AC
  Filled 2018-08-10: qty 50

## 2018-08-10 MED ORDER — ACETAMINOPHEN 10 MG/ML IV SOLN
INTRAVENOUS | Status: AC
  Filled 2018-08-10: qty 100

## 2018-08-10 MED ORDER — ACETAMINOPHEN 500 MG PO TABS
1000.0000 mg | ORAL_TABLET | ORAL | Status: AC
  Administered 2018-08-10: 1000 mg via ORAL
  Filled 2018-08-10: qty 2

## 2018-08-10 MED ORDER — BUPIVACAINE-EPINEPHRINE (PF) 0.25% -1:200000 IJ SOLN
INTRAMUSCULAR | Status: AC
  Filled 2018-08-10: qty 30

## 2018-08-10 MED ORDER — SODIUM CHLORIDE 0.9 % IV SOLN
2.0000 g | INTRAVENOUS | Status: AC
  Administered 2018-08-10: 2 g via INTRAVENOUS
  Filled 2018-08-10: qty 2

## 2018-08-10 MED ORDER — KETAMINE HCL 10 MG/ML IJ SOLN
INTRAMUSCULAR | Status: AC
  Filled 2018-08-10: qty 1

## 2018-08-10 MED ORDER — SCOPOLAMINE 1 MG/3DAYS TD PT72
MEDICATED_PATCH | TRANSDERMAL | Status: AC
  Administered 2018-08-10: 1.5 mg via TRANSDERMAL
  Filled 2018-08-10: qty 1

## 2018-08-10 MED ORDER — LIDOCAINE 2% (20 MG/ML) 5 ML SYRINGE
INTRAMUSCULAR | Status: AC
  Filled 2018-08-10: qty 5

## 2018-08-10 MED ORDER — FENTANYL CITRATE (PF) 250 MCG/5ML IJ SOLN
INTRAMUSCULAR | Status: AC
  Filled 2018-08-10: qty 5

## 2018-08-10 MED ORDER — HYDROCODONE-ACETAMINOPHEN 5-325 MG PO TABS: 1.0000 | ORAL_TABLET | Freq: Once | ORAL | Status: DC

## 2018-08-10 MED ORDER — PROMETHAZINE HCL 25 MG/ML IJ SOLN
12.5000 mg | Freq: Once | INTRAMUSCULAR | Status: AC
  Administered 2018-08-10: 12.5 mg via INTRAVENOUS

## 2018-08-10 MED ORDER — SUGAMMADEX SODIUM 200 MG/2ML IV SOLN
INTRAVENOUS | Status: DC | PRN
  Administered 2018-08-10: 150 mg via INTRAVENOUS

## 2018-08-10 MED ORDER — DEXAMETHASONE SODIUM PHOSPHATE 10 MG/ML IJ SOLN
INTRAMUSCULAR | Status: DC | PRN
  Administered 2018-08-10: 6 mg via INTRAVENOUS

## 2018-08-10 MED ORDER — MEPERIDINE HCL 50 MG/ML IJ SOLN: 6.2500 mg | INTRAMUSCULAR | Status: DC | PRN

## 2018-08-10 MED ORDER — FENTANYL CITRATE (PF) 250 MCG/5ML IJ SOLN
INTRAMUSCULAR | Status: DC | PRN
  Administered 2018-08-10 (×4): 50 ug via INTRAVENOUS

## 2018-08-10 MED ORDER — LACTATED RINGERS IR SOLN
Status: DC | PRN
  Administered 2018-08-10: 1000 mL

## 2018-08-10 MED ORDER — SUGAMMADEX SODIUM 200 MG/2ML IV SOLN
INTRAVENOUS | Status: AC
  Filled 2018-08-10: qty 2

## 2018-08-10 MED ORDER — PROMETHAZINE HCL 25 MG/ML IJ SOLN: 6.2500 mg | INTRAMUSCULAR | Status: DC | PRN

## 2018-08-10 MED ORDER — HYDROMORPHONE HCL 1 MG/ML IJ SOLN
0.2500 mg | INTRAMUSCULAR | Status: DC | PRN
  Administered 2018-08-10 (×4): 0.25 mg via INTRAVENOUS

## 2018-08-10 MED ORDER — KETAMINE HCL 10 MG/ML IJ SOLN
INTRAMUSCULAR | Status: DC | PRN
  Administered 2018-08-10: 30 mg via INTRAVENOUS

## 2018-08-10 SURGICAL SUPPLY — 55 items
APL SKNCLS STERI-STRIP NONHPOA (GAUZE/BANDAGES/DRESSINGS) ×1
APL SRG 38 LTWT LNG FL B (MISCELLANEOUS)
APPLICATOR ARISTA FLEXITIP XL (MISCELLANEOUS) IMPLANT
APPLIER CLIP 5 13 M/L LIGAMAX5 (MISCELLANEOUS)
APPLIER CLIP ROT 10 11.4 M/L (STAPLE)
APR CLP MED LRG 11.4X10 (STAPLE)
APR CLP MED LRG 5 ANG JAW (MISCELLANEOUS)
BAG SPEC RTRVL 10 TROC 200 (ENDOMECHANICALS) ×1
BANDAGE ADH SHEER 1  50/CT (GAUZE/BANDAGES/DRESSINGS) ×12 IMPLANT
BENZOIN TINCTURE PRP APPL 2/3 (GAUZE/BANDAGES/DRESSINGS) ×3 IMPLANT
CABLE HIGH FREQUENCY MONO STRZ (ELECTRODE) ×3 IMPLANT
CHLORAPREP W/TINT 26ML (MISCELLANEOUS) ×3 IMPLANT
CLIP APPLIE 5 13 M/L LIGAMAX5 (MISCELLANEOUS) IMPLANT
CLIP APPLIE ROT 10 11.4 M/L (STAPLE) IMPLANT
CLIP VESOLOCK MED LG 6/CT (CLIP) ×3 IMPLANT
CLOSURE WOUND 1/2 X4 (GAUZE/BANDAGES/DRESSINGS) ×1
COVER MAYO STAND STRL (DRAPES) IMPLANT
COVER SURGICAL LIGHT HANDLE (MISCELLANEOUS) ×3 IMPLANT
COVER WAND RF STERILE (DRAPES) ×3 IMPLANT
DRAPE C-ARM 42X120 X-RAY (DRAPES) IMPLANT
ELECT PENCIL ROCKER SW 15FT (MISCELLANEOUS) IMPLANT
ELECT REM PT RETURN 15FT ADLT (MISCELLANEOUS) ×3 IMPLANT
GLOVE BIO SURGEON STRL SZ7.5 (GLOVE) ×3 IMPLANT
GLOVE BIOGEL PI IND STRL 7.0 (GLOVE) ×3 IMPLANT
GLOVE BIOGEL PI IND STRL 7.5 (GLOVE) ×2 IMPLANT
GLOVE BIOGEL PI INDICATOR 7.0 (GLOVE) ×6
GLOVE BIOGEL PI INDICATOR 7.5 (GLOVE) ×4
GLOVE INDICATOR 8.0 STRL GRN (GLOVE) ×3 IMPLANT
GOWN STRL REUS W/ TWL LRG LVL3 (GOWN DISPOSABLE) ×1 IMPLANT
GOWN STRL REUS W/TWL LRG LVL3 (GOWN DISPOSABLE) ×3
GOWN STRL REUS W/TWL XL LVL3 (GOWN DISPOSABLE) ×9 IMPLANT
GRASPER SUT TROCAR 14GX15 (MISCELLANEOUS) IMPLANT
HEMOSTAT ARISTA ABSORB 3G PWDR (HEMOSTASIS) IMPLANT
HEMOSTAT SNOW SURGICEL 2X4 (HEMOSTASIS) IMPLANT
KIT BASIN OR (CUSTOM PROCEDURE TRAY) ×3 IMPLANT
L-HOOK LAP DISP 36CM (ELECTROSURGICAL)
LHOOK LAP DISP 36CM (ELECTROSURGICAL) IMPLANT
POUCH RETRIEVAL ECOSAC 10 (ENDOMECHANICALS) ×1 IMPLANT
POUCH RETRIEVAL ECOSAC 10MM (ENDOMECHANICALS) ×2
SCISSORS LAP 5X35 DISP (ENDOMECHANICALS) ×3 IMPLANT
SET CHOLANGIOGRAPH MIX (MISCELLANEOUS) IMPLANT
SET IRRIG TUBING LAPAROSCOPIC (IRRIGATION / IRRIGATOR) ×3 IMPLANT
SET TUBE SMOKE EVAC HIGH FLOW (TUBING) ×3 IMPLANT
SLEEVE XCEL OPT CAN 5 100 (ENDOMECHANICALS) ×6 IMPLANT
STRIP CLOSURE SKIN 1/2X4 (GAUZE/BANDAGES/DRESSINGS) ×2 IMPLANT
SUT MNCRL AB 4-0 PS2 18 (SUTURE) ×3 IMPLANT
SUT VIC AB 0 UR5 27 (SUTURE) IMPLANT
SUT VICRYL 0 TIES 12 18 (SUTURE) IMPLANT
SUT VICRYL 0 UR6 27IN ABS (SUTURE) IMPLANT
TOWEL OR 17X26 10 PK STRL BLUE (TOWEL DISPOSABLE) ×3 IMPLANT
TOWEL OR NON WOVEN STRL DISP B (DISPOSABLE) ×3 IMPLANT
TRAY LAPAROSCOPIC (CUSTOM PROCEDURE TRAY) ×3 IMPLANT
TROCAR BLADELESS OPT 5 100 (ENDOMECHANICALS) ×3 IMPLANT
TROCAR XCEL BLUNT TIP 100MML (ENDOMECHANICALS) ×3 IMPLANT
TROCAR XCEL NON-BLD 11X100MML (ENDOMECHANICALS) IMPLANT

## 2018-08-10 NOTE — Transfer of Care (Signed)
Immediate Anesthesia Transfer of Care Note  Patient: Tamara Gould  Procedure(s) Performed: LAPAROSCOPIC CHOLECYSTECTOMY (N/A Abdomen)  Patient Location: PACU  Anesthesia Type:General  Level of Consciousness: awake, alert  and oriented  Airway & Oxygen Therapy: Patient Spontanous Breathing and Patient connected to face mask oxygen  Post-op Assessment: Report given to RN and Post -op Vital signs reviewed and stable  Post vital signs: Reviewed and stable  Last Vitals:  Vitals Value Taken Time  BP 150/60 08/10/2018  8:49 AM  Temp    Pulse 99 08/10/2018  8:52 AM  Resp 23 08/10/2018  8:52 AM  SpO2 100 % 08/10/2018  8:52 AM  Vitals shown include unvalidated device data.  Last Pain:  Vitals:   08/10/18 0631  TempSrc: Oral      Patients Stated Pain Goal: 5 (08/10/18 0552)  Complications: No apparent anesthesia complications

## 2018-08-10 NOTE — Discharge Instructions (Signed)
[facility] °LAPAROSCOPIC SURGERY: POST OP INSTRUCTIONS °Always review your discharge instruction sheet given to you by the facility where your surgery was performed. °IF YOU HAVE DISABILITY OR FAMILY LEAVE FORMS, YOU MUST BRING THEM TO THE OFFICE FOR PROCESSING.   °DO NOT GIVE THEM TO YOUR DOCTOR. ° °PAIN CONTROL ° °1. First take acetaminophen (Tylenol) AND/or ibuprofen (Advil) to control your pain after surgery.  Follow directions on package.  Taking acetaminophen (Tylenol) and/or ibuprofen (Advil) regularly after surgery will help to control your pain and lower the amount of prescription pain medication you may need.  You should not take more than 3,000 mg (3 grams) of acetaminophen (Tylenol) in 24 hours.  You should not take ibuprofen (Advil), aleve, motrin, naprosyn or other NSAIDS if you have a history of stomach ulcers or chronic kidney disease.  °2. A prescription for pain medication may be given to you upon discharge.  Take your pain medication as prescribed, if you still have uncontrolled pain after taking acetaminophen (Tylenol) or ibuprofen (Advil). °3. Use ice packs to help control pain. °4. If you need a refill on your pain medication, please contact your pharmacy.  They will contact our office to request authorization. Prescriptions will not be filled after 5pm or on week-ends. ° °HOME MEDICATIONS °5. Take your usually prescribed medications unless otherwise directed. ° °DIET °6. You should follow a light diet the first few days after arrival home.  Be sure to include lots of fluids daily. Avoid fatty, fried foods.  ° °CONSTIPATION °7. It is common to experience some constipation after surgery and if you are taking pain medication.  Increasing fluid intake and taking a stool softener (such as Colace) will usually help or prevent this problem from occurring.  A mild laxative (Milk of Magnesia or Miralax) should be taken according to package instructions if there are no bowel  movements after 48 hours. ° °WOUND/INCISION CARE °8. Most patients will experience some swelling and bruising in the area of the incisions.  Ice packs will help.  Swelling and bruising can take several days to resolve.  °9. Unless discharge instructions indicate otherwise, follow guidelines below  °a. STERI-STRIPS - you may remove your outer bandages 48 hours after surgery, and you may shower at that time.  You have steri-strips (small skin tapes) in place directly over the incision.  These strips should be left on the skin for 7-10 days.   °b. DERMABOND/SKIN GLUE - you may shower in 24 hours.  The glue will flake off over the next 2-3 weeks. °10. Any sutures or staples will be removed at the office during your follow-up visit. ° °ACTIVITIES °11. You may resume regular (light) daily activities beginning the next day--such as daily self-care, walking, climbing stairs--gradually increasing activities as tolerated.  You may have sexual intercourse when it is comfortable.  Refrain from any heavy lifting or straining until approved by your doctor. °a. You may drive when you are no longer taking prescription pain medication, you can comfortably wear a seatbelt, and you can safely maneuver your car and apply brakes. ° °FOLLOW-UP °12. You should see your doctor in the office for a follow-up appointment approximately 2-3 weeks after your surgery.  You should have been given your post-op/follow-up appointment when your surgery was scheduled.  If you did not receive a post-op/follow-up appointment, make sure that you call for this appointment within a day or two after you arrive home to insure a convenient appointment time. ° °OTHER   INSTRUCTIONS °13.  ° °WHEN TO CALL YOUR DOCTOR: °1. Fever over 101.0 °2. Inability to urinate °3. Continued bleeding from incision. °4. Increased pain, redness, or drainage from the incision. °5. Increasing abdominal pain ° °The clinic staff is available to answer your questions during regular  business hours.  Please don’t hesitate to call and ask to speak to one of the nurses for clinical concerns.  If you have a medical emergency, go to the nearest emergency room or call 911.  A surgeon from [facility] is always on call at the hospital. °[address], [address], [place], [place]  [address] ? [address] [address], [place], [place]   [address] °[phone] ? [phone] ? FAX [phone] °Web site: [email or web address] ° °••••••••• ° ° °Managing Your Pain After Surgery Without Opioids ° ° ° °Thank you for participating in our program to help patients manage their pain after surgery without opioids. This is part of our effort to provide you with the best care possible, without exposing you or your family to the risk that opioids pose. ° °What pain can I expect after surgery? °You can expect to have some pain after surgery. This is normal. The pain is typically worse the day after surgery, and quickly begins to get better. °Many studies have found that many patients are able to manage their pain after surgery with Over-the-Counter (OTC) medications such as Tylenol and Motrin. If you have a condition that does not allow you to take Tylenol or Motrin, notify your surgical team. ° °How will I manage my pain? °The best strategy for controlling your pain after surgery is around the clock pain control with Tylenol (acetaminophen) and Motrin (ibuprofen or Advil). Alternating these medications with each other allows you to maximize your pain control. In addition to Tylenol and Motrin, you can use heating pads or ice packs on your incisions to help reduce your pain. ° °How will I alternate your regular strength over-the-counter pain medication? °You will take a dose of pain medication every three hours. °; Start by taking 650 mg of Tylenol (2 pills of 325 mg) °; 3 hours later take 600 mg of Motrin (3 pills of 200 mg) °; 3 hours after taking the Motrin take 650 mg of Tylenol °; 3 hours  after that take 600 mg of Motrin. ° ° °- 1 - ° °See example - if your first dose of Tylenol is at 12:00 PM ° ° °12:00 PM Tylenol 650 mg (2 pills of 325 mg)  °3:00 PM Motrin 600 mg (3 pills of 200 mg)  °6:00 PM Tylenol 650 mg (2 pills of 325 mg)  °9:00 PM Motrin 600 mg (3 pills of 200 mg)  °Continue alternating every 3 hours  ° °We recommend that you follow this schedule around-the-clock for at least 3 days after surgery, or until you feel that it is no longer needed. Use the table on the last page of this handout to keep track of the medications you are taking. °Important: °Do not take more than 3000mg of Tylenol or 3200mg of Motrin in a 24-hour period. °Do not take ibuprofen/Motrin if you have a history of bleeding stomach ulcers, severe kidney disease, &/or actively taking a blood thinner ° °What if I still have pain? °If you have pain that is not controlled with the over-the-counter pain medications (Tylenol and Motrin or Advil) you might have what we call “breakthrough” pain. You will receive a prescription for a small amount of an opioid   pain medication such as Oxycodone, Tramadol, or Tylenol with Codeine. Use these opioid pills in the first 24 hours after surgery if you have breakthrough pain. Do not take more than 1 pill every 4-6 hours. ° °If you still have uncontrolled pain after using all opioid pills, don't hesitate to call our staff using the number provided. We will help make sure you are managing your pain in the best way possible, and if necessary, we can provide a prescription for additional pain medication. ° ° °Day 1   ° °Time  °Name of Medication Number of pills taken  °Amount of Acetaminophen  °Pain Level  ° °Comments  °AM PM       °AM PM       °AM PM       °AM PM       °AM PM       °AM PM       °AM PM       °AM PM       °Total Daily amount of Acetaminophen °Do not take more than  3,000 mg per day    ° ° °Day 2   ° °Time  °Name of Medication Number of pills °taken  °Amount of Acetaminophen  °Pain  Level  ° °Comments  °AM PM       °AM PM       °AM PM       °AM PM       °AM PM       °AM PM       °AM PM       °AM PM       °Total Daily amount of Acetaminophen °Do not take more than  3,000 mg per day    ° ° °Day 3   ° °Time  °Name of Medication Number of pills taken  °Amount of Acetaminophen  °Pain Level  ° °Comments  °AM PM       °AM PM       °AM PM       °AM PM       ° ° ° °AM PM       °AM PM       °AM PM       °AM PM       °Total Daily amount of Acetaminophen °Do not take more than  3,000 mg per day    ° ° °Day 4   ° °Time  °Name of Medication Number of pills taken  °Amount of Acetaminophen  °Pain Level  ° °Comments  °AM PM       °AM PM       °AM PM       °AM PM       °AM PM       °AM PM       °AM PM       °AM PM       °Total Daily amount of Acetaminophen °Do not take more than  3,000 mg per day    ° ° °Day 5   ° °Time  °Name of Medication Number °of pills taken  °Amount of Acetaminophen  °Pain Level  ° °Comments  °AM PM       °AM PM       °AM PM       °AM PM       °AM PM       °AM PM       °  AM PM       °AM PM       °Total Daily amount of Acetaminophen °Do not take more than  3,000 mg per day    ° ° ° °Day 6   ° °Time  °Name of Medication Number of pills °taken  °Amount of Acetaminophen  °Pain Level  °Comments  °AM PM       °AM PM       °AM PM       °AM PM       °AM PM       °AM PM       °AM PM       °AM PM       °Total Daily amount of Acetaminophen °Do not take more than  3,000 mg per day    ° ° °Day 7   ° °Time  °Name of Medication Number of pills taken  °Amount of Acetaminophen  °Pain Level  ° °Comments  °AM PM       °AM PM       °AM PM       °AM PM       °AM PM       °AM PM       °AM PM       °AM PM       °Total Daily amount of Acetaminophen °Do not take more than  3,000 mg per day    ° ° ° ° °For additional information about how and where to safely dispose of unused opioid °medications - https://www.morepowerfulnc.org ° °Disclaimer: This document contains information and/or instructional materials adapted  from [facility] for the typical patient with your condition. It does not replace medical advice from your health care provider because your experience may differ from that of the °typical patient. Talk to your health care provider if you have any questions about this °document, your condition or your treatment plan. °Adapted from [facility] ° ° °

## 2018-08-10 NOTE — Anesthesia Postprocedure Evaluation (Signed)
Anesthesia Post Note  Patient: Lark Eyster  Procedure(s) Performed: LAPAROSCOPIC CHOLECYSTECTOMY (N/A Abdomen)     Patient location during evaluation: PACU Anesthesia Type: General Level of consciousness: awake and alert Pain management: pain level controlled Vital Signs Assessment: post-procedure vital signs reviewed and stable Respiratory status: spontaneous breathing, nonlabored ventilation, respiratory function stable and patient connected to nasal cannula oxygen Cardiovascular status: blood pressure returned to baseline and stable Postop Assessment: no apparent nausea or vomiting Anesthetic complications: no    Last Vitals:  Vitals:   08/10/18 0930 08/10/18 0945  BP: 126/83 129/83  Pulse: 77 83  Resp: 19 20  Temp: 36.8 C 36.9 C  SpO2: 100% 99%       Shelton Silvas

## 2018-08-10 NOTE — Anesthesia Procedure Notes (Signed)
Procedure Name: Intubation Date/Time: 08/10/2018 7:34 AM Performed by: Maxwell Caul, CRNA Pre-anesthesia Checklist: Patient identified, Emergency Drugs available, Suction available and Patient being monitored Patient Re-evaluated:Patient Re-evaluated prior to induction Oxygen Delivery Method: Circle system utilized Preoxygenation: Pre-oxygenation with 100% oxygen Induction Type: IV induction Ventilation: Mask ventilation without difficulty Laryngoscope Size: Mac and 4 Grade View: Grade I Tube type: Oral Tube size: 7.5 mm Number of attempts: 1 Airway Equipment and Method: Stylet Placement Confirmation: ETT inserted through vocal cords under direct vision,  positive ETCO2 and breath sounds checked- equal and bilateral Secured at: 22 cm Tube secured with: Tape Dental Injury: Teeth and Oropharynx as per pre-operative assessment

## 2018-08-10 NOTE — Op Note (Signed)
Tamara SquibbMonique Gould [id number] 1990/10/21 08/10/2018  Laparoscopic Cholecystectomy Procedure Note  Indications: This patient presents with Right sided abdominal pain and gallbladder polyp and will undergo laparoscopic cholecystectomy.  Pre-operative Diagnosis: gallbladder polyp, right abdominal pain  Post-operative Diagnosis: Same  Surgeon: Gaynelle AduEric Miley Blanchett MD FACS  Assistants: none  Anesthesia: General endotracheal anesthesia  Procedure Details  The patient was seen again in the Holding Room. The risks, benefits, complications, treatment options, and expected outcomes were discussed with the patient. The possibilities of reaction to medication, pulmonary aspiration, perforation of viscus, bleeding, recurrent infection, finding a normal gallbladder, the need for additional procedures, failure to diagnose a condition, the possible need to convert to an open procedure, and creating a complication requiring transfusion or operation were discussed with the patient. The likelihood of improving the patient's symptoms with return to their baseline status is good.  The patient and/or family concurred with the proposed plan, giving informed consent. The site of surgery properly noted. The patient was taken to Operating Room, identified as Tamara Gould and the procedure verified as Laparoscopic Cholecystectomy. A Time Out was held and the above information confirmed. Antibiotic prophylaxis was administered.   Prior to the induction of general anesthesia, antibiotic prophylaxis was administered. General endotracheal anesthesia was then administered and tolerated well. After the induction, the abdomen was prepped with Chloraprep and draped in the sterile fashion. The patient was positioned in the supine position.  Local anesthetic agent was injected into the skin near the umbilicus and an incision made. We dissected down to the abdominal fascia with blunt dissection.  The fascia was incised vertically and we  entered the peritoneal cavity bluntly.  A pursestring suture of 0-Vicryl was placed around the fascial opening.  The Hasson cannula was inserted and secured with the stay suture.  Pneumoperitoneum was then created with CO2 and tolerated well without any adverse changes in the patient's vital signs. An 5-mm port was placed in the subxiphoid position.  Two 5-mm ports were placed in the right upper quadrant. All skin incisions were infiltrated with a local anesthetic agent before making the incision and placing the trocars.   We positioned the patient in reverse Trendelenburg, tilted slightly to the patient's left.  The gallbladder was identified, the fundus grasped and retracted cephalad. Adhesions were lysed bluntly and with the electrocautery where indicated, taking care not to injure any adjacent organs or viscus. The infundibulum was grasped and retracted laterally, exposing the peritoneum overlying the triangle of Calot. This was then divided and exposed in a blunt fashion. A critical view of the cystic duct and cystic artery was obtained. The cystic artery crossed anterior to the cystic duct.  The cystic duct was clearly identified and bluntly dissected circumferentially.   The cystic artery which had been identified & dissected free was ligated with plastic hemoclips and divided as well. The cystic duct was then ligated with hemoclips and divided.    The gallbladder was dissected from the liver bed in retrograde fashion with the electrocautery. There was some spillage of bile from the gallbladder. The gallbladder was removed and placed in an Ecco sac.  The gallbladder and Ecco sac were then removed through the umbilical port site. The liver bed was irrigated and inspected. Hemostasis was achieved with the electrocautery. Copious irrigation was utilized and was repeatedly aspirated until clear. Local was infiltrated in bilateral lateral abdominal walls as a block under laparoscopic guidance. The  pursestring suture was used to close the umbilical fascia.    We again  inspected the right upper quadrant for hemostasis.  The umbilical closure was inspected and there was no air leak and nothing trapped within the closure. Pneumoperitoneum was released as we removed the trocars.  4-0 Monocryl was used to close the skin.   Benzoin, steri-strips, and clean dressings were applied. The patient was then extubated and brought to the recovery room in stable condition. Instrument, sponge, and needle counts were correct at closure and at the conclusion of the case.   Findings: +critical view; no indirect hernias. Perhaps some weakness in Rt direct inguinal space but no obvious defect  Estimated Blood Loss: Minimal         Drains: none         Specimens: Gallbladder           Complications: None; patient tolerated the procedure well.         Disposition: PACU - hemodynamically stable.         Condition: stable  Tamara Gould. Andrey Campanile, MD, FACS General, Bariatric, & Minimally Invasive Surgery [facility], [place]

## 2018-08-10 NOTE — H&P (Signed)
Tamara Gould is an 28 y.o. female.   Chief Complaint: here for surgery HPI: 28 yo AAF here for cholecystectomy for gallbladder polyp. Denies medical changes since seen in clinic. No trips to ed or hospital. No f/v/n/v/cp/sob  Past Medical History:  Diagnosis Date  . Allergy    seasonal  . Cholelithiasis   . Depression   . Heart murmur    patient reports as benign     Past Surgical History:  Procedure Laterality Date  . COLONOSCOPY    . WISDOM TOOTH EXTRACTION     all 4    Family History  Problem Relation Age of Onset  . Hypertension Father   . Anxiety disorder Sister   . Depression Brother   . Colon cancer Neg Hx   . Esophageal cancer Neg Hx   . Heart disease Neg Hx   . Stroke Neg Hx    Social History:  reports that she has never smoked. She has never used smokeless tobacco. She reports that she does not drink alcohol or use drugs.  Allergies: No Known Allergies  Medications Prior to Admission  Medication Sig Dispense Refill  . FIBER ADULT GUMMIES PO Take 3 tablets by mouth daily.    . Probiotic Product (PROBIOTIC DAILY PO) Take 1 capsule by mouth daily.      No results found for this or any previous visit (from the past 48 hour(s)). No results found.  Review of Systems  Constitutional: Negative for weight loss.  HENT: Negative for nosebleeds.   Eyes: Negative for blurred vision.  Respiratory: Negative for shortness of breath.   Cardiovascular: Negative for chest pain, palpitations, orthopnea and PND.       Denies DOE  Gastrointestinal: Negative for nausea and vomiting.  Genitourinary: Negative for dysuria and hematuria.  Musculoskeletal: Negative.   Skin: Negative for itching and rash.  Neurological: Negative for dizziness, focal weakness, seizures, loss of consciousness and headaches.       Denies TIAs, amaurosis fugax  Endo/Heme/Allergies: Does not bruise/bleed easily.  Psychiatric/Behavioral: The patient is not nervous/anxious.     Blood pressure  106/75, pulse 75, temperature 98.3 F (36.8 C), temperature source Oral, height 5\' 5"  (1.651 m), weight 68.5 kg, last menstrual period 07/19/2018, SpO2 100 %. Physical Exam  Vitals reviewed. Constitutional: She is oriented to person, place, and time. She appears well-developed and well-nourished. No distress.  HENT:  Head: Normocephalic and atraumatic.  Right Ear: External ear normal.  Left Ear: External ear normal.  Eyes: Conjunctivae are normal. No scleral icterus.  Neck: Normal range of motion. Neck supple. No tracheal deviation present. No thyromegaly present.  Cardiovascular: Normal rate, regular rhythm and intact distal pulses.  Murmur heard. Respiratory: Effort normal and breath sounds normal. No stridor. No respiratory distress. She has no wheezes.  Musculoskeletal:        General: No tenderness or edema.  Lymphadenopathy:    She has no cervical adenopathy.  Neurological: She is alert and oriented to person, place, and time. She exhibits normal muscle tone.  Skin: Skin is warm and dry. No rash noted. She is not diaphoretic. No erythema. No pallor.  Psychiatric: She has a normal mood and affect. Her behavior is normal. Judgment and thought content normal.     Assessment/Plan Gallbladder polyp  To OR for lap cholecystectomy All questions asked and answered  Mary Sella. Andrey Campanile, MD, FACS General, Bariatric, & Minimally Invasive Surgery [facility], [place]   Gaynelle Adu, MD 08/10/2018, 7:20 AM

## 2018-08-11 ENCOUNTER — Encounter (HOSPITAL_COMMUNITY): Payer: Self-pay | Admitting: General Surgery

## 2018-08-24 ENCOUNTER — Ambulatory Visit (HOSPITAL_COMMUNITY)
Admission: RE | Admit: 2018-08-24 | Discharge: 2018-08-24 | Disposition: A | Discharge status: Home / Self Care | Payer: 59 | Source: Ambulatory Visit | Attending: Obstetrics & Gynecology | Admitting: Obstetrics & Gynecology

## 2018-08-24 DIAGNOSIS — N938 Other specified abnormal uterine and vaginal bleeding: Secondary | ICD-10-CM | POA: Insufficient documentation

## 2018-08-29 ENCOUNTER — Telehealth: Payer: Self-pay | Admitting: *Deleted

## 2018-08-29 NOTE — Telephone Encounter (Signed)
Pt called to office for recent u/s results.  Pt aware message to be sent to provider for review and recommendations.   Pt was last seen in October 2019. Please review and advise.

## 2018-09-04 ENCOUNTER — Other Ambulatory Visit: Payer: Self-pay | Admitting: Obstetrics & Gynecology

## 2018-09-04 DIAGNOSIS — N83201 Unspecified ovarian cyst, right side: Secondary | ICD-10-CM

## 2018-09-04 NOTE — Progress Notes (Signed)
Gyn u/s ordered (again) for follow up.

## 2018-10-17 ENCOUNTER — Ambulatory Visit (INDEPENDENT_AMBULATORY_CARE_PROVIDER_SITE_OTHER): Payer: 59 | Admitting: Obstetrics & Gynecology

## 2018-10-17 ENCOUNTER — Other Ambulatory Visit: Payer: Self-pay

## 2018-10-17 ENCOUNTER — Encounter: Payer: Self-pay | Admitting: Obstetrics & Gynecology

## 2018-10-17 ENCOUNTER — Encounter: Payer: Self-pay | Admitting: *Deleted

## 2018-10-17 VITALS — BP 110/75 | HR 84 | Wt 160.0 lb

## 2018-10-17 DIAGNOSIS — N939 Abnormal uterine and vaginal bleeding, unspecified: Secondary | ICD-10-CM

## 2018-10-17 DIAGNOSIS — Z3202 Encounter for pregnancy test, result negative: Secondary | ICD-10-CM | POA: Diagnosis not present

## 2018-10-17 DIAGNOSIS — N938 Other specified abnormal uterine and vaginal bleeding: Secondary | ICD-10-CM | POA: Diagnosis not present

## 2018-10-17 LAB — POCT URINE PREGNANCY: Preg Test, Ur: NEGATIVE

## 2018-10-17 NOTE — Progress Notes (Signed)
   Subjective:    Patient ID: Tamara Gould, female    DOB: 10-Feb-1991, 28 y.o.   MRN: [id number]  HPI 28 yo single G0 here with continued DUB. She has had a normal TSH, gyn u/s, cervical cultures, and CBC. This issue has been going on for almost a year. Nothing makes it better or worse. She reports that she has not been sexually active for almost a year. She used depo provera from 2012 to 2017. The bleeding is not everyday as it was in 2019, but she still bleeds about 2 days per week in addition to her usual monthly period.  Review of Systems She again declines flu and Gardasil vaccines.    Objective:   Physical Exam  UPT negative, consent signed, time out done Cervix prepped with betadine and grasped with a single tooth tenaculum Uterus sounded to 9 cm Pipelle used for 2 passes with a moderate amount of tissue obtained. She tolerated the procedure well.     Assessment & Plan:  DUB with negative w/u- await pathology from [facility] I will notify her of the results I prescribed Lo ovral and she will start this with NMP

## 2018-10-17 NOTE — Progress Notes (Signed)
Follow up on AUB, pt still having issues with it.

## 2019-07-01 IMAGING — US US ABDOMEN LIMITED
1 series · 14 of 25 positions shown · non-contrast
Comparison: None.

CLINICAL DATA: Abdominal pain

EXAM:
ULTRASOUND ABDOMEN LIMITED RIGHT UPPER QUADRANT

[Series 1: us abdomen limited · 0.11mm/px · 14 of 77 slices shown]
[im 1/77]
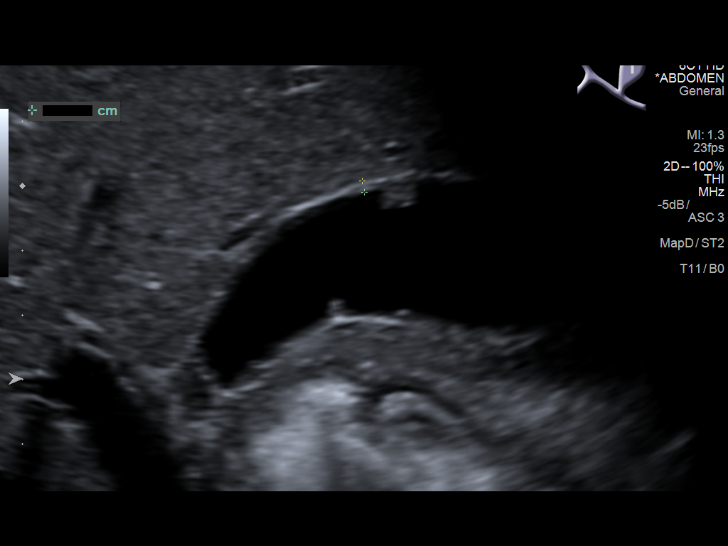
[im 7/77]
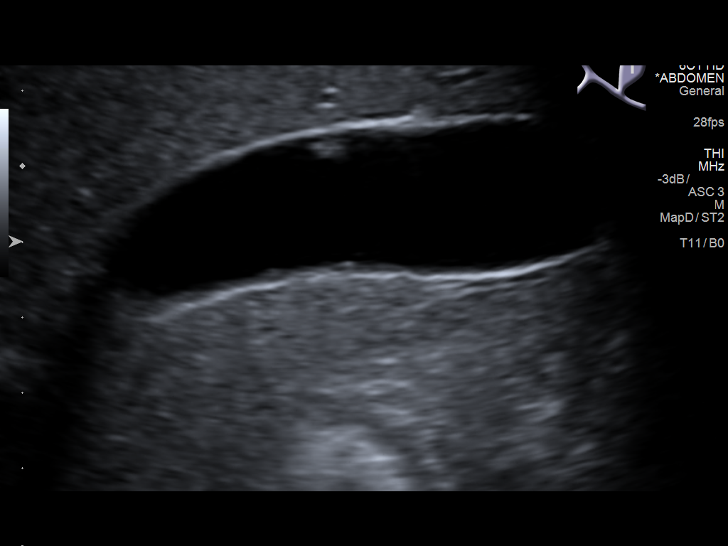
[im 13/77]
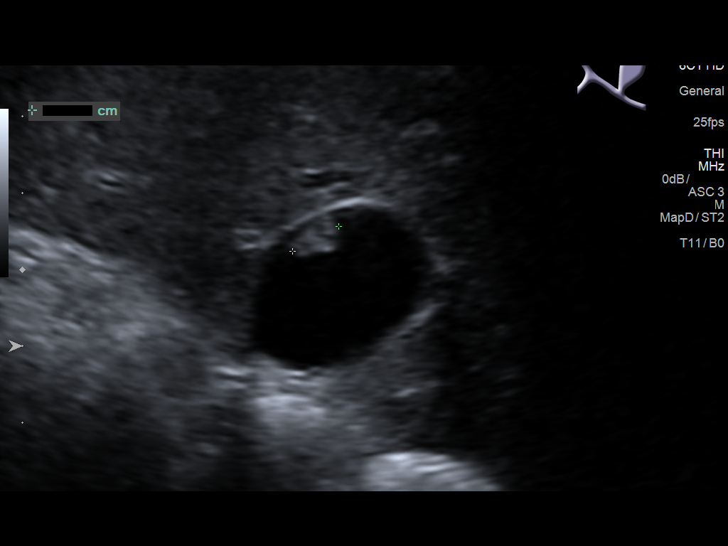
[im 20/77]
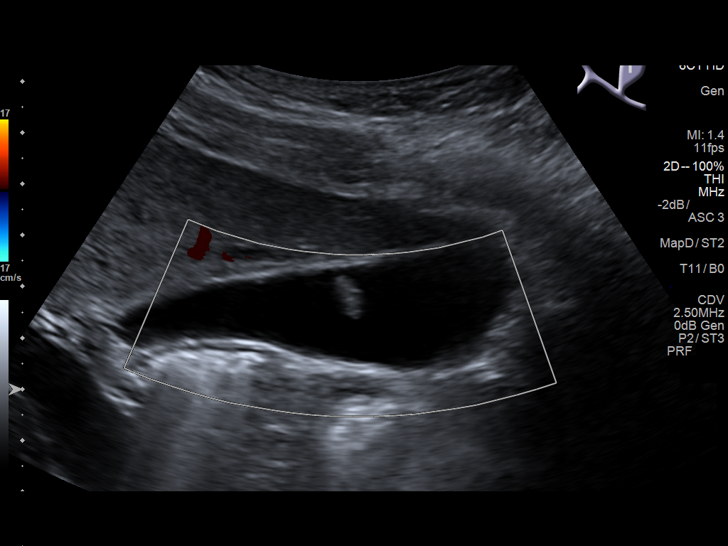
[im 26/77]
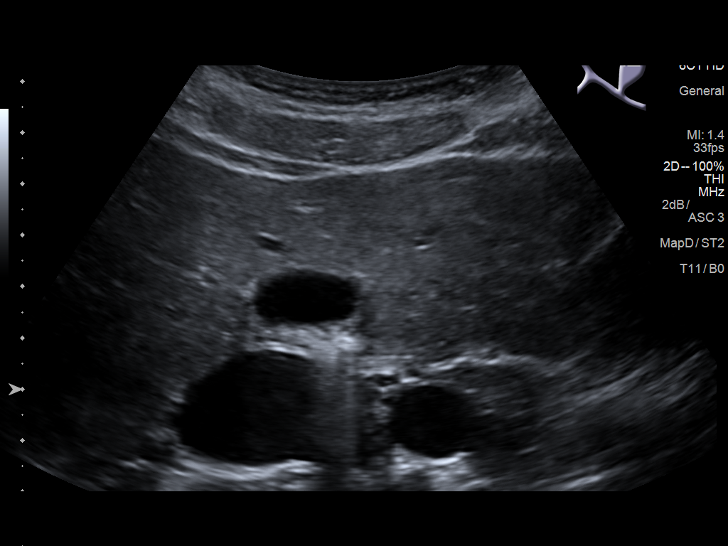
[im 29/77]
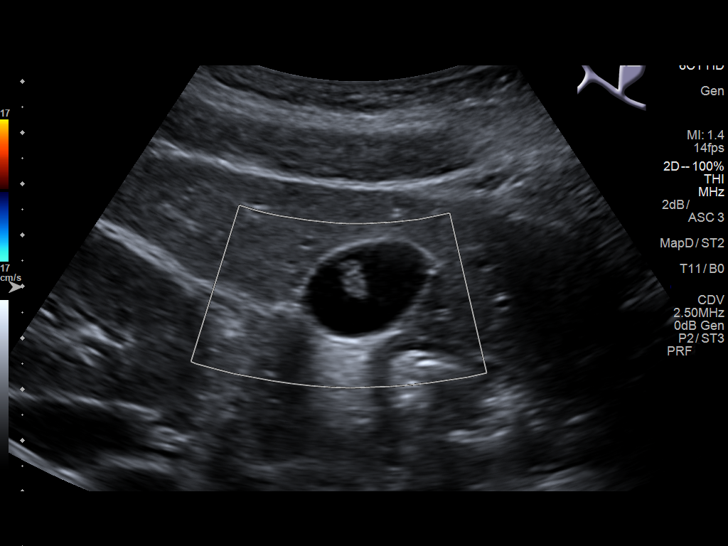
[im 35/77]
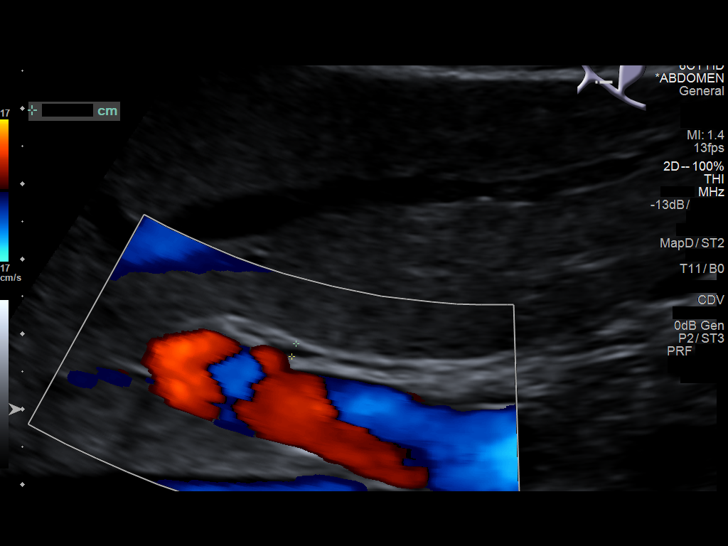
[im 42/77]
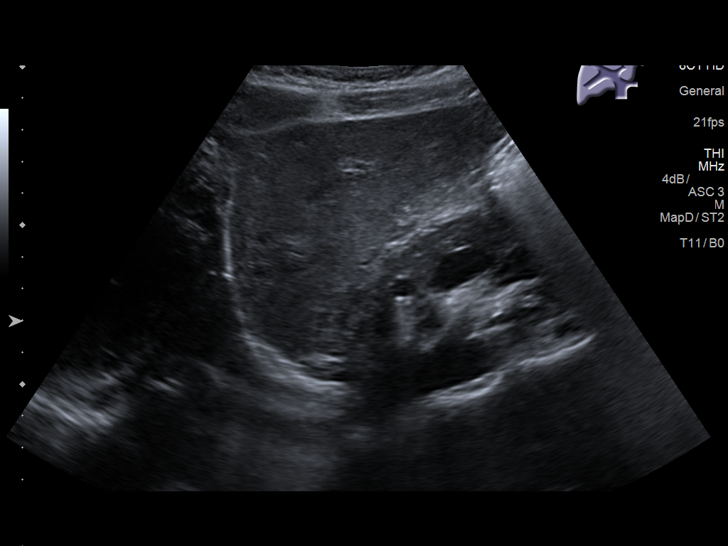
[im 48/77]
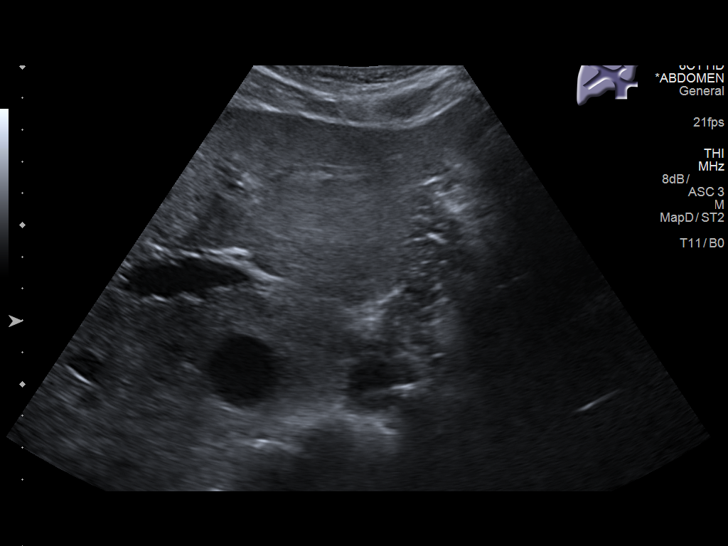
[im 51/77]
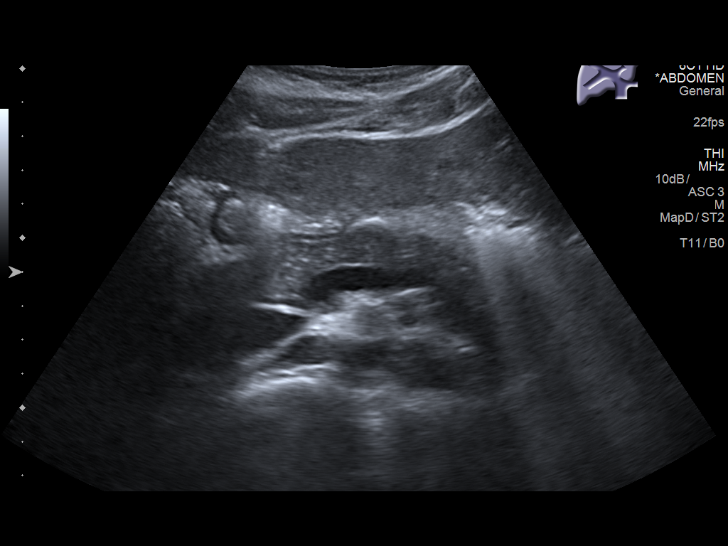
[im 58/77]
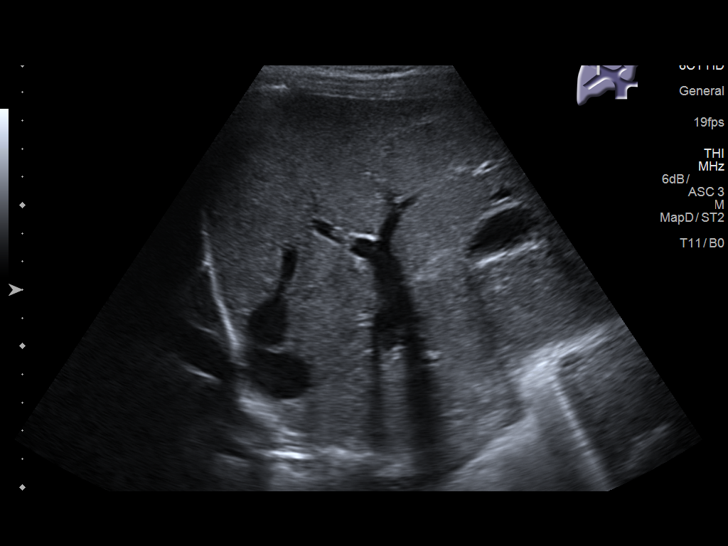
[im 64/77]
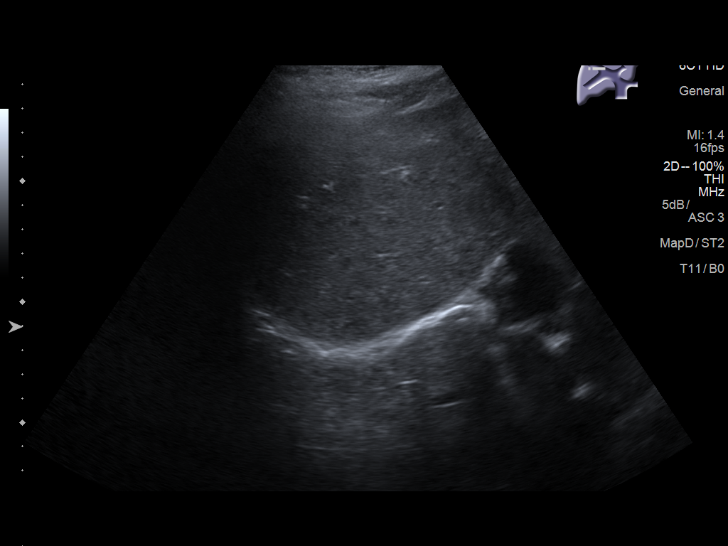
[im 70/77]
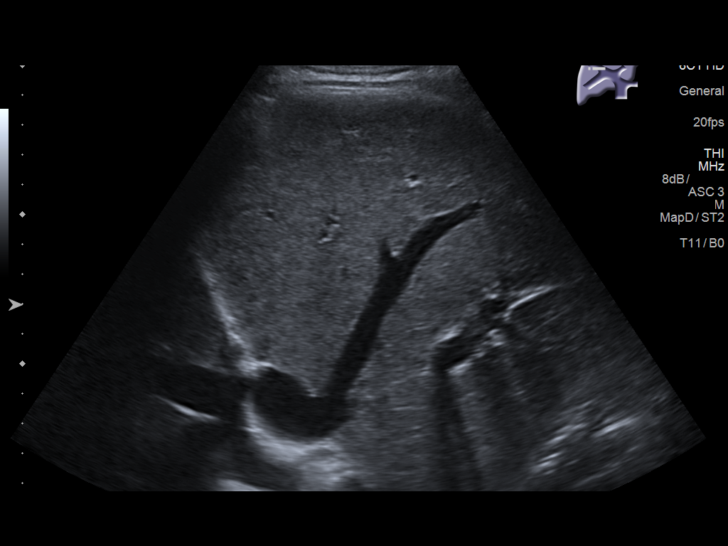
[im 77/77]
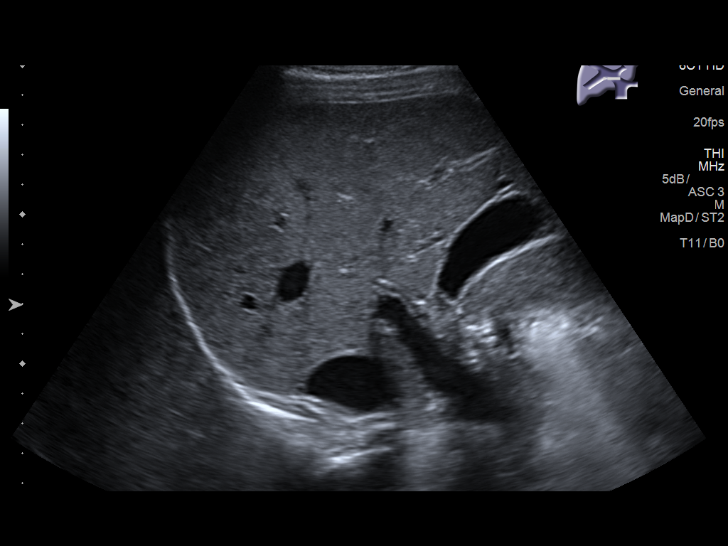

[14 of 25 positions shown; findings below may reference images not displayed]

FINDINGS: Gallbladder:

There are several echogenic foci in the gallbladder which neither
move nor shadow consistent with polyps. The largest of these
polypoid structures measures 1.1 cm in size. There are no echogenic
foci in the gallbladder which move and shadow as is expected with
gallstones. No gallbladder wall thickening or pericholecystic fluid.
No sonographic Murphy sign noted by sonographer.

Common bile duct:

Diameter: 3 mm. No intrahepatic or extrahepatic biliary duct
dilatation.

Liver:

No focal lesion identified. Within normal limits in parenchymal
echogenicity. Portal vein is patent on color Doppler imaging with
normal direction of blood flow towards the liver.
IMPRESSION: Several gallbladder polyps, largest measuring 1.1 cm in size. Per
consensus guidelines, a polyp of this size warrants surgical
consultation. No gallstones evident. No gallbladder wall thickening
or pericholecystic fluid.

Study otherwise unremarkable.
